# Patient Record
Sex: Female | Born: 1975 | Race: White | Hispanic: No | Marital: Married | Smoking: Current every day smoker
Health system: Southern US, Community
[De-identification: ages and names within clinical notes are randomized; demographics above are authoritative.]

## PROBLEM LIST (undated history)

## (undated) DIAGNOSIS — N6019 Diffuse cystic mastopathy of unspecified breast: Secondary | ICD-10-CM

## (undated) DIAGNOSIS — K219 Gastro-esophageal reflux disease without esophagitis: Secondary | ICD-10-CM

## (undated) DIAGNOSIS — M8580 Other specified disorders of bone density and structure, unspecified site: Secondary | ICD-10-CM

## (undated) DIAGNOSIS — F909 Attention-deficit hyperactivity disorder, unspecified type: Secondary | ICD-10-CM

## (undated) HISTORY — DX: Gastro-esophageal reflux disease without esophagitis: K21.9

## (undated) HISTORY — DX: Diffuse cystic mastopathy of unspecified breast: N60.19

## (undated) HISTORY — PX: TUBAL LIGATION: SHX77

## (undated) HISTORY — PX: ESOPHAGEAL DILATION: SHX303

## (undated) HISTORY — PX: WISDOM TOOTH EXTRACTION: SHX21

## (undated) HISTORY — DX: Other specified disorders of bone density and structure, unspecified site: M85.80

## (undated) HISTORY — DX: Attention-deficit hyperactivity disorder, unspecified type: F90.9

## (undated) HISTORY — PX: CATARACT EXTRACTION: SUR2

---

## 2011-05-26 ENCOUNTER — Ambulatory Visit (INDEPENDENT_AMBULATORY_CARE_PROVIDER_SITE_OTHER): Payer: 59 | Admitting: Gastroenterology

## 2011-05-26 ENCOUNTER — Other Ambulatory Visit (INDEPENDENT_AMBULATORY_CARE_PROVIDER_SITE_OTHER): Payer: 59

## 2011-05-26 ENCOUNTER — Encounter: Payer: Self-pay | Admitting: Gastroenterology

## 2011-05-26 DIAGNOSIS — K625 Hemorrhage of anus and rectum: Secondary | ICD-10-CM

## 2011-05-26 DIAGNOSIS — K219 Gastro-esophageal reflux disease without esophagitis: Secondary | ICD-10-CM

## 2011-05-26 LAB — IBC PANEL
Iron: 79 ug/dL (ref 42–145)
Saturation Ratios: 23.8 % (ref 20.0–50.0)
Transferrin: 237.1 mg/dL (ref 212.0–360.0)

## 2011-05-26 LAB — BASIC METABOLIC PANEL
BUN: 11 mg/dL (ref 6–23)
GFR: 88.29 mL/min (ref >60.00)
Glucose, Bld: 81 mg/dL (ref 70–99)
Potassium: 3.5 mEq/L (ref 3.5–5.1)

## 2011-05-26 LAB — HEPATIC FUNCTION PANEL
ALT: 11 U/L (ref 0–35)
AST: 15 U/L (ref 0–37)
Albumin: 4 g/dL (ref 3.5–5.2)
Total Protein: 7.2 g/dL (ref 6.0–8.3)

## 2011-05-26 LAB — CBC WITH DIFFERENTIAL/PLATELET
Eosinophils Relative: 4.6 % (ref 0.0–5.0)
HCT: 42.9 % (ref 36.0–46.0)
Lymphocytes Relative: 36.2 % (ref 12.0–46.0)
Monocytes Relative: 7.5 % (ref 3.0–12.0)
Neutrophils Relative %: 51.2 % (ref 43.0–77.0)
Platelets: 232 10*3/uL (ref 150.0–400.0)
WBC: 7.1 10*3/uL (ref 4.5–10.5)

## 2011-05-26 LAB — TSH: TSH: 1.91 u[IU]/mL (ref 0.35–5.50)

## 2011-05-26 MED ORDER — PEG-KCL-NACL-NASULF-NA ASC-C 100 G PO SOLR: 1.0000 | Freq: Once | ORAL | Status: DC

## 2011-05-26 NOTE — Patient Instructions (Addendum)
Please go to the basement upon leaving today to have your labs done. You have been scheduled for an Propofol Endoscopy/Colonoscopy with separate instructions given. Your prep kit has been sent to your pharmacy for you to pick up. You have been given a GERD handout to read.

## 2011-05-26 NOTE — Progress Notes (Signed)
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 She's had 6 months of vague abdominal discomfort, rather severe acid reflux with intermittent dysphagia, and periodic diarrhea with one episode of bright red blood per rectum 2 weeks ago. She still has intermittent rectal bleeding. Lab data showed normal CBC a negative H. pylori antigen. She denies alcohol, and instead use but does smoke a half-pack of cigarettes per day. Patient has not had previous barium radiographs or endoscopic procedures. Associated with her symptoms has been a 20 pound weight loss over the last 6 months. Her family history is remarkable for multiple allergies including esophageal cancer. Patient denies any specific hepatobiliary complaints or systemic complaints such as fever or chills.   I have reviewed this patient's present history, medical and surgical past history, allergies and medications.    Past Medical History  Diagnosis Date  . GERD (gastroesophageal reflux disease)    Past Surgical History  Procedure Date  . Tubal ligation     reports that she has been smoking.  She has never used smokeless tobacco. She reports that she drinks alcohol. She reports that she does not use illicit drugs. family history includes Breast cancer in her paternal aunt; Diabetes in her paternal grandmother; Esophageal cancer in her paternal grandfather; Heart disease in her maternal grandfather and maternal grandmother; Ovarian cancer in her paternal aunt; Stomach cancer in her maternal grandfather; and Uterine cancer in her mother. Allergies  Allergen Reactions  . Bee   . Fish Allergy       ROS: The remainder of the 10 point ROS is negative... she denies any gynecologic problems. Recent stool for occult blood was positive.     Physical Exam: General well developed well nourished patient in no acute distress, appearing her stated age Eyes PERRLA, no icterus,  fundoscopic exam per opthamologist Skin no lesions noted Neck supple, no adenopathy, no thyroid enlargement, no tenderness Chest clear to percussion and auscultation Heart no significant murmurs, gallops or rubs noted Abdomen no hepatosplenomegaly masses or tenderness, BS normal.  Extremities no acute joint lesions, edema, phlebitis or evidence of cellulitis. Neurologic patient oriented x 3, cranial nerves intact, no focal neurologic deficits noted. Psychological mental status normal and normal affect.  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 I have scheduled endoscopic exam with propofol sedation at her request, have reviewed a reflux regime, and have prescribed Dexilant 60 mg every morning. Colonoscopy also is scheduled. Screening labs including anemia and celiac profiles have been ordered. Otherwise, she is to continue medications per primary care. No diagnosis found.

## 2011-05-29 ENCOUNTER — Encounter: Payer: Self-pay | Admitting: Gastroenterology

## 2011-05-29 ENCOUNTER — Telehealth: Payer: Self-pay

## 2011-05-29 ENCOUNTER — Ambulatory Visit (AMBULATORY_SURGERY_CENTER): Payer: 59 | Admitting: Gastroenterology

## 2011-05-29 DIAGNOSIS — R110 Nausea: Secondary | ICD-10-CM | POA: Insufficient documentation

## 2011-05-29 DIAGNOSIS — K625 Hemorrhage of anus and rectum: Secondary | ICD-10-CM

## 2011-05-29 DIAGNOSIS — K219 Gastro-esophageal reflux disease without esophagitis: Secondary | ICD-10-CM

## 2011-05-29 DIAGNOSIS — R1310 Dysphagia, unspecified: Secondary | ICD-10-CM | POA: Insufficient documentation

## 2011-05-29 DIAGNOSIS — R634 Abnormal weight loss: Secondary | ICD-10-CM

## 2011-05-29 LAB — FERRITIN: Ferritin: 21.6 ng/mL (ref 10.0–291.0)

## 2011-05-29 MED ORDER — SODIUM CHLORIDE 0.9 % IV SOLN: 500.0000 mL | INTRAVENOUS | Status: DC

## 2011-05-29 NOTE — Patient Instructions (Signed)
Discharge instructions given with verbal understanding. Dilatation diet given. Avoid NSAIDS for the next two weeks. Resume previous medications.

## 2011-05-29 NOTE — Progress Notes (Signed)
Patient did not experience any of the following events: a burn prior to discharge; a fall within the facility; wrong site/side/patient/procedure/implant event; or a hospital transfer or hospital admission upon discharge from the facility. (G8907) Patient did not have preoperative order for IV antibiotic SSI prophylaxis. (G8918)  

## 2011-05-30 ENCOUNTER — Telehealth: Payer: Self-pay | Admitting: *Deleted

## 2011-05-30 DIAGNOSIS — K219 Gastro-esophageal reflux disease without esophagitis: Secondary | ICD-10-CM

## 2011-05-30 NOTE — Telephone Encounter (Signed)

## 2011-05-30 NOTE — Telephone Encounter (Signed)
Patient is scheduled for an abdominal ultrasound for 06/06/11 9:00 [facility].  I have attempted to reach her by phone, but she did not answer and has no machine.  I will mail her a letter with the appt details.

## 2011-05-31 ENCOUNTER — Encounter: Payer: Self-pay | Admitting: Gastroenterology

## 2011-06-05 ENCOUNTER — Encounter: Payer: Self-pay | Admitting: Gastroenterology

## 2011-06-05 ENCOUNTER — Telehealth: Payer: Self-pay | Admitting: Gastroenterology

## 2011-06-05 NOTE — Telephone Encounter (Signed)
lmom that she was negative for Celiac disease and H. Pylori; she may call back for questions.

## 2011-06-06 ENCOUNTER — Other Ambulatory Visit: Payer: Self-pay | Admitting: Family Medicine

## 2011-06-06 ENCOUNTER — Telehealth: Payer: Self-pay | Admitting: *Deleted

## 2011-06-06 ENCOUNTER — Encounter: Payer: Self-pay | Admitting: *Deleted

## 2011-06-06 ENCOUNTER — Ambulatory Visit (HOSPITAL_COMMUNITY)
Admission: RE | Admit: 2011-06-06 | Discharge: 2011-06-06 | Disposition: A | Discharge status: Home / Self Care | Payer: 59 | Source: Ambulatory Visit | Attending: Gastroenterology | Admitting: Gastroenterology

## 2011-06-06 DIAGNOSIS — R160 Hepatomegaly, not elsewhere classified: Secondary | ICD-10-CM

## 2011-06-06 DIAGNOSIS — R109 Unspecified abdominal pain: Secondary | ICD-10-CM | POA: Insufficient documentation

## 2011-06-06 DIAGNOSIS — R634 Abnormal weight loss: Secondary | ICD-10-CM | POA: Insufficient documentation

## 2011-06-06 NOTE — Telephone Encounter (Signed)
Message copied by Leonette Monarch on Tue Jun 06, 2011  3:56 PM ------      Message from: Jarold Motto, DAVID R      Created: Tue Jun 06, 2011  2:40 PM       Please schedule hepatic MRI exam her ultrasound report.

## 2011-06-07 ENCOUNTER — Ambulatory Visit (HOSPITAL_COMMUNITY)
Admission: RE | Admit: 2011-06-07 | Discharge: 2011-06-07 | Disposition: A | Discharge status: Home / Self Care | Payer: 59 | Source: Ambulatory Visit | Attending: Family Medicine | Admitting: Family Medicine

## 2011-06-07 DIAGNOSIS — R160 Hepatomegaly, not elsewhere classified: Secondary | ICD-10-CM

## 2011-06-07 DIAGNOSIS — D1803 Hemangioma of intra-abdominal structures: Secondary | ICD-10-CM | POA: Insufficient documentation

## 2011-06-07 DIAGNOSIS — K7689 Other specified diseases of liver: Secondary | ICD-10-CM | POA: Insufficient documentation

## 2011-06-07 MED ORDER — GADOBENATE DIMEGLUMINE 529 MG/ML IV SOLN: 12.0000 mL | Freq: Once | INTRAVENOUS | Status: AC | PRN

## 2011-06-09 ENCOUNTER — Telehealth: Payer: Self-pay | Admitting: Gastroenterology

## 2011-06-09 NOTE — Telephone Encounter (Signed)
Followup ultrasound in 6 months. This hemangioma is no call for alarm.Marland KitchenMarland Kitchen

## 2011-06-09 NOTE — Telephone Encounter (Signed)
Informed pt of Dr Norval Gable findings and suggestions. She will continue her Dexilant and see [place] on 07/10/11. Reminder sent for repeat U/S in 1 year.

## 2011-06-09 NOTE — Telephone Encounter (Signed)
Already done

## 2011-06-09 NOTE — Telephone Encounter (Signed)
Dr Jarold Motto, please review MRI I forwarded to you. Jennifer Parsons has called and is very upset; doesn't know what to do. Can you advise on f/u, severity of dx, etc. Thanks.

## 2011-07-10 ENCOUNTER — Ambulatory Visit: Payer: 59 | Admitting: Gastroenterology

## 2011-12-12 ENCOUNTER — Other Ambulatory Visit: Payer: Self-pay | Admitting: Family Medicine

## 2011-12-12 DIAGNOSIS — D1803 Hemangioma of intra-abdominal structures: Secondary | ICD-10-CM

## 2011-12-14 ENCOUNTER — Ambulatory Visit (HOSPITAL_COMMUNITY): Payer: 59

## 2012-06-10 ENCOUNTER — Telehealth: Payer: Self-pay | Admitting: *Deleted

## 2012-06-10 NOTE — Telephone Encounter (Signed)
Message copied by Florene Glen on Mon Jun 10, 2012  1:21 PM ------      Message from: Florene Glen      Created: Fri Jun 09, 2011 11:55 AM       Needs f/u u/s in 1 yr to evaluate liver hemangioma

## 2012-06-10 NOTE — Telephone Encounter (Signed)
lmom for pt to call back

## 2012-06-27 NOTE — Telephone Encounter (Signed)
Left message with female to have pt call back. 

## 2012-07-26 NOTE — Telephone Encounter (Signed)
Called pt at [facility], Dr Kathi Der [facility], informed her I have tried to get in touch with her, but maybe I had the wrong numbers. Informed pt she was to have a f/u U/S last June and now she needs another one. Informed her she does not need to see [place], we just want to make sure the hemangioma is not getting larger. Pt stated she has been f/u up with Dr Modesto Charon, but finances are tight now. She states if she decides to have one, she will have it done at her ofc and fax [place] the results. She thanked me for the call.

## 2012-10-28 ENCOUNTER — Ambulatory Visit (INDEPENDENT_AMBULATORY_CARE_PROVIDER_SITE_OTHER): Payer: 59

## 2012-10-28 ENCOUNTER — Encounter: Payer: Self-pay | Admitting: Nurse Practitioner

## 2012-10-28 ENCOUNTER — Ambulatory Visit (INDEPENDENT_AMBULATORY_CARE_PROVIDER_SITE_OTHER): Payer: 59 | Admitting: Nurse Practitioner

## 2012-10-28 VITALS — BP 123/78 | HR 63 | Temp 98.3°F

## 2012-10-28 DIAGNOSIS — S99912A Unspecified injury of left ankle, initial encounter: Secondary | ICD-10-CM

## 2012-10-28 DIAGNOSIS — S82892A Other fracture of left lower leg, initial encounter for closed fracture: Secondary | ICD-10-CM

## 2012-10-28 DIAGNOSIS — S99911A Unspecified injury of right ankle, initial encounter: Secondary | ICD-10-CM

## 2012-10-28 DIAGNOSIS — S8990XA Unspecified injury of unspecified lower leg, initial encounter: Secondary | ICD-10-CM

## 2012-10-28 DIAGNOSIS — S82899A Other fracture of unspecified lower leg, initial encounter for closed fracture: Secondary | ICD-10-CM

## 2012-10-28 DIAGNOSIS — S93401A Sprain of unspecified ligament of right ankle, initial encounter: Secondary | ICD-10-CM

## 2012-10-28 MED ORDER — HYDROCODONE-ACETAMINOPHEN 5-325 MG PO TABS: 1.0000 | ORAL_TABLET | Freq: Four times a day (QID) | ORAL | Status: DC | PRN

## 2012-10-28 MED ORDER — HYDROCODONE-ACETAMINOPHEN 10-325 MG PO TABS: 1.0000 | ORAL_TABLET | Freq: Four times a day (QID) | ORAL | Status: DC | PRN

## 2012-10-28 NOTE — Progress Notes (Signed)
  Subjective:    Patient ID: Jennifer Parsons, female    DOB: [birth year], 37 y.o.   MRN: [id number]  HPI    Review of Systems     Objective:   Physical Exam        Assessment & Plan:

## 2012-10-28 NOTE — Patient Instructions (Signed)
Ankle Fracture  A fracture is a break in the bone. A cast or splint is used to protect and keep your injured bone from moving.   HOME CARE INSTRUCTIONS    Use your crutches as directed.   To lessen the swelling, keep the injured leg elevated while sitting or lying down.   Apply ice to the injury for 15 to 20 minutes, 3 to 4 times per day while awake for 2 days. Put the ice in a plastic bag and place a thin towel between the bag of ice and your cast.   If you have a plaster or fiberglass cast:   Do not try to scratch the skin under the cast using sharp or pointed objects.   Check the skin around the cast every day. You may put lotion on any red or sore areas.   Keep your cast dry and clean.   If you have a plaster splint:   Wear the splint as directed.   You may loosen the elastic around the splint if your toes become numb, tingle, or turn cold or blue.   Do not put pressure on any part of your cast or splint; it may break. Rest your cast only on a pillow the first 24 hours until it is fully hardened.   Your cast or splint can be protected during bathing with a plastic bag. Do not lower the cast or splint into water.   Take medications as directed by your caregiver. Only take over-the-counter or prescription medicines for pain, discomfort, or fever as directed by your caregiver.   Do not drive a vehicle until your caregiver specifically tells you it is safe to do so.   If your caregiver has given you a follow-up appointment, it is very important to keep that appointment. Not keeping the appointment could result in a chronic or permanent injury, pain, and disability. If there is any problem keeping the appointment, you must call back to this facility for assistance.  SEEK IMMEDIATE MEDICAL CARE IF:    Your cast gets damaged or breaks.   You have continued severe pain or more swelling than you did before the cast was put on.   Your skin or toenails below the injury turn blue or gray, or feel cold or  numb.   There is a bad smell or new stains and/or purulent (pus like) drainage coming from under the cast.  If you do not have a window in your cast for observing the wound, a discharge or minor bleeding may show up as a stain on the outside of your cast. Report these findings to your caregiver.  MAKE SURE YOU:    Understand these instructions.   Will watch your condition.   Will get help right away if you are not doing well or get worse.  Document Released: 06/23/2000 Document Revised: 09/18/2011 Document Reviewed: 01/28/2008  [employer] Patient Information 2013 [employer].

## 2012-10-28 NOTE — Addendum Note (Signed)
Addended by: Bennie Pierini on: 10/28/2012 10:06 AM   Modules accepted: Orders

## 2012-10-28 NOTE — Progress Notes (Signed)
  Subjective:    Patient ID: Jennifer Parsons, female    DOB: 1976/05/30, 37 y.o.   MRN: [id number]  HPI-Patient fell of her porch Saturday night and injured her left ankle. Patient thought was just a sprain and has been walking on it. It is swollen and tender to touch.    Review of Systems  Neurological: Positive for weakness.  All other systems reviewed and are negative.       Objective:   Physical Exam  Constitutional: She appears well-developed and well-nourished.  Cardiovascular: Normal rate.   Pulmonary/Chest: Effort normal.  Musculoskeletal:  Left ankle has lateral ecchymosis and edema. Very tender to light touch. Decreased ROM due to pain with any movement.   Right ankle xray- No fracture Left ankle fracture- nondisplaced lateral malleolar fracture Preliminary reading by Paulene Floor, FNP  [facility]        Assessment & Plan:  Left ankle fracture  To ortho asap  Mary-Margaret Daphine Deutscher, FNP

## 2013-01-23 ENCOUNTER — Other Ambulatory Visit: Payer: Self-pay | Admitting: Orthopedic Surgery

## 2013-01-23 ENCOUNTER — Ambulatory Visit (INDEPENDENT_AMBULATORY_CARE_PROVIDER_SITE_OTHER): Payer: 59

## 2013-01-23 DIAGNOSIS — S8290XD Unspecified fracture of unspecified lower leg, subsequent encounter for closed fracture with routine healing: Secondary | ICD-10-CM

## 2013-01-23 DIAGNOSIS — S82402G Unspecified fracture of shaft of left fibula, subsequent encounter for closed fracture with delayed healing: Secondary | ICD-10-CM

## 2013-04-01 ENCOUNTER — Encounter: Payer: Self-pay | Admitting: *Deleted

## 2013-04-01 ENCOUNTER — Other Ambulatory Visit: Payer: Self-pay | Admitting: *Deleted

## 2013-04-01 DIAGNOSIS — R6884 Jaw pain: Secondary | ICD-10-CM

## 2013-04-01 DIAGNOSIS — H9209 Otalgia, unspecified ear: Secondary | ICD-10-CM

## 2013-04-01 MED ORDER — MELOXICAM 15 MG PO TABS: 15.0000 mg | ORAL_TABLET | Freq: Every day | ORAL | Status: DC

## 2013-04-01 NOTE — Progress Notes (Signed)
Patient ID: Jennifer Parsons, female   DOB: 12/20/1975, 37 y.o.   MRN: [id number] Dr Christell Constant states that he wants mobic called in for pt due to ear pain/jaw pain. Pt to decrease jaw movement and start mobic - if npo better in a few days to see dr Christell Constant.

## 2013-04-22 ENCOUNTER — Ambulatory Visit (INDEPENDENT_AMBULATORY_CARE_PROVIDER_SITE_OTHER): Payer: 59 | Admitting: *Deleted

## 2013-04-22 DIAGNOSIS — Z23 Encounter for immunization: Secondary | ICD-10-CM

## 2013-05-19 ENCOUNTER — Ambulatory Visit (INDEPENDENT_AMBULATORY_CARE_PROVIDER_SITE_OTHER): Payer: 59

## 2013-05-19 ENCOUNTER — Encounter: Payer: Self-pay | Admitting: Family Medicine

## 2013-05-19 ENCOUNTER — Ambulatory Visit (INDEPENDENT_AMBULATORY_CARE_PROVIDER_SITE_OTHER): Payer: 59 | Admitting: Family Medicine

## 2013-05-19 ENCOUNTER — Other Ambulatory Visit: Payer: Self-pay | Admitting: Family Medicine

## 2013-05-19 ENCOUNTER — Encounter (INDEPENDENT_AMBULATORY_CARE_PROVIDER_SITE_OTHER): Payer: Self-pay

## 2013-05-19 VITALS — BP 111/70 | HR 53 | Temp 97.0°F | Ht 63.0 in | Wt 143.0 lb

## 2013-05-19 DIAGNOSIS — R5381 Other malaise: Secondary | ICD-10-CM

## 2013-05-19 DIAGNOSIS — K219 Gastro-esophageal reflux disease without esophagitis: Secondary | ICD-10-CM

## 2013-05-19 DIAGNOSIS — R0602 Shortness of breath: Secondary | ICD-10-CM

## 2013-05-19 DIAGNOSIS — J209 Acute bronchitis, unspecified: Secondary | ICD-10-CM

## 2013-05-19 DIAGNOSIS — Z0000 Encounter for general adult medical examination without abnormal findings: Secondary | ICD-10-CM

## 2013-05-19 DIAGNOSIS — R05 Cough: Secondary | ICD-10-CM

## 2013-05-19 DIAGNOSIS — D1803 Hemangioma of intra-abdominal structures: Secondary | ICD-10-CM

## 2013-05-19 DIAGNOSIS — J309 Allergic rhinitis, unspecified: Secondary | ICD-10-CM

## 2013-05-19 LAB — POCT CBC
HCT, POC: 46.5 % (ref 37.7–47.9)
Hemoglobin: 15.5 g/dL (ref 12.2–16.2)
MCH, POC: 30.8 pg (ref 27–31.2)
MCHC: 33.3 g/dL (ref 31.8–35.4)
MCV: 92.7 fL (ref 80–97)
POC LYMPH PERCENT: 39.6 %L (ref 10–50)

## 2013-05-19 MED ORDER — AZITHROMYCIN 250 MG PO TABS: ORAL_TABLET | ORAL | Status: DC

## 2013-05-19 MED ORDER — FLUTICASONE PROPIONATE 50 MCG/ACT NA SUSP: 2.0000 | Freq: Every day | NASAL | Status: DC

## 2013-05-19 NOTE — Addendum Note (Signed)
Addended by: Tommas Olp on: 05/19/2013 11:33 AM   Modules accepted: Orders

## 2013-05-19 NOTE — Progress Notes (Signed)
Subjective:    Patient ID: Jennifer Parsons, female    DOB: [birth year], 37 y.o.   MRN: [id number]  HPI Patient here today for annual physical exam- no pap, and cough. She continues to have head congestion and a nighttime cough. She continues to smoke about half a pack a day. The ear problem that we treated several weeks ago is better.     Patient Active Problem List   Diagnosis Date Noted  . Hemorrhage of rectum and anus 05/29/2011  . Esophageal reflux 05/29/2011  . Nausea 05/29/2011  . Esophageal dysphagia 05/29/2011  . Rectal bleeding 05/26/2011  . GERD (gastroesophageal reflux disease) 05/26/2011   Outpatient Encounter Prescriptions as of 05/19/2013  Medication Sig  . EPINEPHrine (EPIPEN 2-PAK) 0.3 mg/0.3 mL DEVI Inject into the muscle once.  . meloxicam (MOBIC) 15 MG tablet Take 1 tablet (15 mg total) by mouth daily.  . [DISCONTINUED] dexlansoprazole (DEXILANT) 60 MG capsule Take 60 mg by mouth daily.      Review of Systems  Constitutional: Negative.   HENT: Positive for congestion (for 10days), postnasal drip and sinus pressure.   Eyes: Negative.   Respiratory: Positive for cough and shortness of breath (just starting).   Cardiovascular: Negative.   Gastrointestinal: Negative.   Endocrine: Negative.   Genitourinary: Negative.   Musculoskeletal: Negative.   Skin: Negative.   Allergic/Immunologic: Negative.   Neurological: Negative.   Hematological: Negative.   Psychiatric/Behavioral: Negative.        Objective:   Physical Exam  Nursing note and vitals reviewed. Constitutional: She is oriented to person, place, and time. She appears well-developed and well-nourished. No distress.  HENT:  Head: Normocephalic and atraumatic.  Right Ear: External ear normal.  Left Ear: External ear normal.  Nose: Nose normal.  Mouth/Throat: Oropharynx is clear and moist. No oropharyngeal exudate.  There is sinus tenderness in the frontal maxillary and ethmoid areas. On exam there is  minimal nasal congestion and there is no drainage in her throat.  Eyes: Conjunctivae and EOM are normal. Pupils are equal, round, and reactive to light. Right eye exhibits no discharge. Left eye exhibits no discharge. No scleral icterus.  Visualization of the left fundus was difficult due to patient's light sensitivity  Neck: Normal range of motion. Neck supple. No JVD present. No thyromegaly present.  Cardiovascular: Normal rate, regular rhythm, normal heart sounds and intact distal pulses.  Exam reveals no gallop and no friction rub.   No murmur heard. At 60 per minute  Pulmonary/Chest: Effort normal and breath sounds normal. No respiratory distress. She has no wheezes. She has no rales. She exhibits no tenderness.  Patient has a dry cough  Abdominal: Soft. Bowel sounds are normal. She exhibits no mass. There is tenderness. There is no rebound and no guarding.  There is epigastric tenderness but no organ enlargement  Musculoskeletal: Normal range of motion. She exhibits no edema and no tenderness.  Lymphadenopathy:    She has no cervical adenopathy.  Neurological: She is alert and oriented to person, place, and time. She has normal reflexes. No cranial nerve deficit.  Skin: Skin is warm and dry. No rash noted.  Psychiatric: She has a normal mood and affect. Her behavior is normal. Judgment and thought content normal.   BP 111/70  Pulse 53  Temp(Src) 97 F (36.1 C) (Oral)  Ht 5\' 3"  (1.6 m)  Wt 143 lb (64.864 kg)  BMI 25.34 kg/m2  LMP 05/05/2013        Assessment & Plan:  1. Annual physical exam   2. GERD (gastroesophageal reflux disease)   3. Other malaise and fatigue   4. Cough   5. Shortness of breath   6. Liver hemangioma   7. Allergic rhinitis   8. Acute bronchitis    Orders Placed This Encounter  Procedures  . DG Chest 2 View    Standing Status: Future     Number of Occurrences:      Standing Expiration Date: 07/19/2014    Order Specific Question:  Reason for  Exam (SYMPTOM  OR DIAGNOSIS REQUIRED)    Answer:  cough    Order Specific Question:  Is the patient pregnant?    Answer:  No    Order Specific Question:  Preferred imaging location?    Answer:  Internal  . Hepatic function panel  . BMP8+EGFR  . Lipid panel  . Thyroid Panel With TSH  . Vit D  25 hydroxy (rtn osteoporosis monitoring)  . POCT CBC    We will schedule a visit for you to have a Pap and pelvic. We will also schedule an ultrasound of your liver to followup on the hemangioma  Meds ordered this encounter  Medications  . azithromycin (ZITHROMAX) 250 MG tablet    Sig: 2 tablets the first day then one daily until completed    Dispense:  6 tablet    Refill:  0  . fluticasone (FLONASE) 50 MCG/ACT nasal spray    Sig: Place 2 sprays into both nostrils daily.    Dispense:  16 g    Refill:  6   Patient Instructions  Continue current medications. Continue good therapeutic lifestyle changes.  Fall precautions discussed with patient. Follow up as planned and earlier as needed.  Take Mucinex maximum strength one twice daily over the counter with a large glass of water Take other medications as directed Use sample of Symbicort 80/4.5   --2 puffs twice daily, rinse mouth after using Try to stop smoking Drink plenty of fluids   Nyra Capes MD

## 2013-05-19 NOTE — Patient Instructions (Addendum)
Continue current medications. Continue good therapeutic lifestyle changes.  Fall precautions discussed with patient. Follow up as planned and earlier as needed.  Take Mucinex maximum strength one twice daily over the counter with a large glass of water Take other medications as directed Use sample of Symbicort 80/4.5   --2 puffs twice daily, rinse mouth after using Try to stop smoking Drink plenty of fluids

## 2013-05-20 LAB — BMP8+EGFR
Calcium: 9.2 mg/dL (ref 8.7–10.2)
Creatinine, Ser: 0.91 mg/dL (ref 0.57–1.00)
GFR calc Af Amer: 94 mL/min/{1.73_m2} (ref >59)
GFR calc non Af Amer: 81 mL/min/{1.73_m2} (ref >59)
Sodium: 140 mmol/L (ref 134–144)

## 2013-05-20 LAB — HEPATIC FUNCTION PANEL
ALT: 12 IU/L (ref 0–32)
AST: 20 IU/L (ref 0–40)
Albumin: 4.1 g/dL (ref 3.5–5.5)
Bilirubin, Direct: 0.2 mg/dL (ref 0.00–0.40)
Total Bilirubin: 0.6 mg/dL (ref 0.0–1.2)
Total Protein: 6.3 g/dL (ref 6.0–8.5)

## 2013-05-20 LAB — THYROID PANEL WITH TSH
Free Thyroxine Index: 2 (ref 1.2–4.9)
T4, Total: 6.5 ug/dL (ref 4.5–12.0)

## 2013-05-20 LAB — LIPID PANEL
Chol/HDL Ratio: 2.1 ratio units (ref 0.0–4.4)
Cholesterol, Total: 147 mg/dL (ref 100–199)
HDL: 71 mg/dL (ref >39)
LDL Calculated: 60 mg/dL (ref 0–99)
VLDL Cholesterol Cal: 16 mg/dL (ref 5–40)

## 2013-05-20 LAB — VITAMIN D 25 HYDROXY (VIT D DEFICIENCY, FRACTURES): Vit D, 25-Hydroxy: 31.1 ng/mL (ref 30.0–100.0)

## 2013-09-24 ENCOUNTER — Ambulatory Visit (INDEPENDENT_AMBULATORY_CARE_PROVIDER_SITE_OTHER): Payer: 59 | Admitting: Family Medicine

## 2013-09-24 ENCOUNTER — Encounter: Payer: Self-pay | Admitting: Family Medicine

## 2013-09-24 VITALS — BP 109/72 | HR 68 | Temp 97.9°F | Ht 63.0 in | Wt 144.0 lb

## 2013-09-24 DIAGNOSIS — R635 Abnormal weight gain: Secondary | ICD-10-CM

## 2013-09-24 MED ORDER — PHENTERMINE HCL 37.5 MG PO CAPS: 37.5000 mg | ORAL_CAPSULE | ORAL | Status: DC

## 2013-09-24 NOTE — Progress Notes (Signed)
   Subjective:    Patient ID: Jennifer Parsons, female    DOB: 1975-10-19, 38 y.o.   MRN: [id number]  HPI This 38 y.o. female presents for evaluation of weight loss.  She has recent labs and she has  Put on over 20 pounds in the last few years..   Review of Systems No chest pain, SOB, HA, dizziness, vision change, N/V, diarrhea, constipation, dysuria, urinary urgency or frequency, myalgias, arthralgias or rash.     Objective:   Physical Exam  Vital signs noted  Well developed well nourished female.  HEENT - Head atraumatic Normocephalic                Eyes - PERRLA, Conjuctiva - clear Sclera- Clear EOMI                Ears - EAC's Wnl TM's Wnl Gross Hearing WNL                Nose - Nares patent                 Throat - oropharanx wnl Respiratory - Lungs CTA bilateral Cardiac - RRR S1 and S2 without murmur GI - Abdomen soft Nontender and bowel sounds active x 4 Extremities - No edema. Neuro - Grossly intact.      Assessment & Plan:  Weight gain - Plan: phentermine 37.5 MG capsule #30 w/3rf Lysbeth Penner FNP

## 2013-12-29 ENCOUNTER — Other Ambulatory Visit: Payer: Self-pay | Admitting: Nurse Practitioner

## 2013-12-29 ENCOUNTER — Other Ambulatory Visit (INDEPENDENT_AMBULATORY_CARE_PROVIDER_SITE_OTHER): Payer: 59

## 2013-12-29 DIAGNOSIS — N390 Urinary tract infection, site not specified: Secondary | ICD-10-CM

## 2013-12-29 LAB — POCT URINALYSIS DIPSTICK
BILIRUBIN UA: NEGATIVE
Glucose, UA: NEGATIVE
Ketones, UA: NEGATIVE
NITRITE UA: POSITIVE
PH UA: 6
Spec Grav, UA: >=1.03
Urobilinogen, UA: NEGATIVE

## 2013-12-29 LAB — POCT UA - MICROSCOPIC ONLY
Casts, Ur, LPF, POC: NEGATIVE
Crystals, Ur, HPF, POC: NEGATIVE
Mucus, UA: NEGATIVE

## 2013-12-29 LAB — POCT WET PREP (WET MOUNT)
KOH WET PREP POC: POSITIVE
TRICHOMONAS WET PREP HPF POC: NEGATIVE

## 2013-12-29 MED ORDER — CIPROFLOXACIN HCL 500 MG PO TABS: 500.0000 mg | ORAL_TABLET | Freq: Two times a day (BID) | ORAL | Status: DC

## 2013-12-29 MED ORDER — FLUCONAZOLE 150 MG PO TABS
ORAL_TABLET | ORAL | Status: DC
Start: 2013-12-29 — End: 2014-11-10

## 2014-03-26 ENCOUNTER — Other Ambulatory Visit: Payer: Self-pay | Admitting: Nurse Practitioner

## 2014-03-27 NOTE — Telephone Encounter (Signed)
Patient last seen in office on 09-24-13. Please advise on refill. If approved please route to Pool A so nurse can call patient to pick up. Rx will print

## 2014-05-26 DIAGNOSIS — Z0000 Encounter for general adult medical examination without abnormal findings: Secondary | ICD-10-CM | POA: Diagnosis not present

## 2014-11-10 ENCOUNTER — Other Ambulatory Visit: Payer: Self-pay | Admitting: Nurse Practitioner

## 2014-11-10 ENCOUNTER — Other Ambulatory Visit (INDEPENDENT_AMBULATORY_CARE_PROVIDER_SITE_OTHER): Payer: 59

## 2014-11-10 DIAGNOSIS — R102 Pelvic and perineal pain: Secondary | ICD-10-CM

## 2014-11-10 DIAGNOSIS — R300 Dysuria: Secondary | ICD-10-CM

## 2014-11-10 LAB — POCT UA - MICROSCOPIC ONLY
CASTS, UR, LPF, POC: NEGATIVE
CRYSTALS, UR, HPF, POC: NEGATIVE
Mucus, UA: NEGATIVE

## 2014-11-10 LAB — POCT URINALYSIS DIPSTICK
Bilirubin, UA: NEGATIVE
Glucose, UA: NEGATIVE
KETONES UA: NEGATIVE
Nitrite, UA: NEGATIVE
Protein, UA: NEGATIVE
SPEC GRAV UA: 1.01
Urobilinogen, UA: NEGATIVE
pH, UA: 7.5

## 2014-11-10 LAB — POCT WET PREP (WET MOUNT): KOH Wet Prep POC: POSITIVE

## 2014-11-10 MED ORDER — FLUCONAZOLE 150 MG PO TABS: ORAL_TABLET | ORAL | Status: DC

## 2014-11-10 NOTE — Progress Notes (Signed)
Lab only 

## 2014-11-11 LAB — URINE CULTURE: Organism ID, Bacteria: NO GROWTH

## 2014-11-21 ENCOUNTER — Other Ambulatory Visit: Payer: Self-pay | Admitting: Family Medicine

## 2014-11-23 NOTE — Telephone Encounter (Signed)
NOt  Seen in office since 09/2013 by oxford

## 2014-12-03 ENCOUNTER — Other Ambulatory Visit: Payer: Self-pay | Admitting: Nurse Practitioner

## 2014-12-03 MED ORDER — DOXYCYCLINE HYCLATE 100 MG PO TABS: 100.0000 mg | ORAL_TABLET | Freq: Two times a day (BID) | ORAL | Status: DC

## 2015-02-26 ENCOUNTER — Other Ambulatory Visit: Payer: Self-pay | Admitting: Nurse Practitioner

## 2015-02-26 ENCOUNTER — Other Ambulatory Visit (INDEPENDENT_AMBULATORY_CARE_PROVIDER_SITE_OTHER): Payer: 59

## 2015-02-26 DIAGNOSIS — Z0000 Encounter for general adult medical examination without abnormal findings: Secondary | ICD-10-CM | POA: Diagnosis not present

## 2015-02-26 DIAGNOSIS — IMO0001 Reserved for inherently not codable concepts without codable children: Secondary | ICD-10-CM

## 2015-02-26 NOTE — Progress Notes (Signed)
Lab only 

## 2015-02-27 LAB — CMP14+EGFR
ALBUMIN: 4.1 g/dL (ref 3.5–5.5)
ALK PHOS: 69 IU/L (ref 39–117)
ALT: 12 IU/L (ref 0–32)
AST: 18 IU/L (ref 0–40)
Albumin/Globulin Ratio: 1.5 (ref 1.1–2.5)
BILIRUBIN TOTAL: 0.4 mg/dL (ref 0.0–1.2)
BUN / CREAT RATIO: 10 (ref 8–20)
BUN: 8 mg/dL (ref 6–20)
CHLORIDE: 102 mmol/L (ref 97–108)
CO2: 24 mmol/L (ref 18–29)
Calcium: 9.3 mg/dL (ref 8.7–10.2)
Creatinine, Ser: 0.83 mg/dL (ref 0.57–1.00)
GFR calc Af Amer: 103 mL/min/{1.73_m2} (ref >59)
GFR calc non Af Amer: 90 mL/min/{1.73_m2} (ref >59)
GLUCOSE: 72 mg/dL (ref 65–99)
Globulin, Total: 2.7 g/dL (ref 1.5–4.5)
Potassium: 4.2 mmol/L (ref 3.5–5.2)
SODIUM: 141 mmol/L (ref 134–144)
Total Protein: 6.8 g/dL (ref 6.0–8.5)

## 2015-02-27 LAB — CBC WITH DIFFERENTIAL/PLATELET
BASOS ABS: 0.1 10*3/uL (ref 0.0–0.2)
Basos: 1 %
EOS (ABSOLUTE): 0.5 10*3/uL — AB (ref 0.0–0.4)
Eos: 7 %
Hematocrit: 44.8 % (ref 34.0–46.6)
Hemoglobin: 15.3 g/dL (ref 11.1–15.9)
IMMATURE GRANULOCYTES: 0 %
Immature Grans (Abs): 0 10*3/uL (ref 0.0–0.1)
LYMPHS: 46 %
Lymphocytes Absolute: 3 10*3/uL (ref 0.7–3.1)
MCH: 32.1 pg (ref 26.6–33.0)
MCHC: 34.2 g/dL (ref 31.5–35.7)
MCV: 94 fL (ref 79–97)
MONOS ABS: 0.6 10*3/uL (ref 0.1–0.9)
Monocytes: 9 %
NEUTROS PCT: 37 %
Neutrophils Absolute: 2.5 10*3/uL (ref 1.4–7.0)
PLATELETS: 298 10*3/uL (ref 150–379)
RBC: 4.76 x10E6/uL (ref 3.77–5.28)
RDW: 13.3 % (ref 12.3–15.4)
WBC: 6.6 10*3/uL (ref 3.4–10.8)

## 2015-02-27 LAB — THYROID PANEL WITH TSH
Free Thyroxine Index: 2.4 (ref 1.2–4.9)
T3 UPTAKE RATIO: 30 % (ref 24–39)
T4 TOTAL: 8.1 ug/dL (ref 4.5–12.0)
TSH: 2.98 u[IU]/mL (ref 0.450–4.500)

## 2015-02-27 LAB — LIPID PANEL
CHOLESTEROL TOTAL: 145 mg/dL (ref 100–199)
Chol/HDL Ratio: 1.7 ratio units (ref 0.0–4.4)
HDL: 83 mg/dL (ref >39)
LDL Calculated: 44 mg/dL (ref 0–99)
TRIGLYCERIDES: 89 mg/dL (ref 0–149)
VLDL Cholesterol Cal: 18 mg/dL (ref 5–40)

## 2015-03-02 ENCOUNTER — Ambulatory Visit (INDEPENDENT_AMBULATORY_CARE_PROVIDER_SITE_OTHER): Payer: 59 | Admitting: Nurse Practitioner

## 2015-03-02 ENCOUNTER — Encounter: Payer: Self-pay | Admitting: Nurse Practitioner

## 2015-03-02 VITALS — BP 111/74 | HR 51 | Temp 97.2°F | Ht 63.0 in | Wt 140.0 lb

## 2015-03-02 DIAGNOSIS — Z0000 Encounter for general adult medical examination without abnormal findings: Secondary | ICD-10-CM | POA: Diagnosis not present

## 2015-03-02 NOTE — Patient Instructions (Signed)

## 2015-03-02 NOTE — Progress Notes (Signed)
   Subjective:    Patient ID: Jennifer Parsons, female    DOB: 1976-02-19, 39 y.o.   MRN: [id number]  HPI Patient in today for CPE without pap. SHe is doing well  today without complaints.    Review of Systems  Constitutional: Negative.   HENT: Negative.   Respiratory: Negative.   Cardiovascular: Negative.   Gastrointestinal: Negative.   Genitourinary: Negative.   Neurological: Negative.   Psychiatric/Behavioral: Negative.   All other systems reviewed and are negative.      Objective:   Physical Exam  Constitutional: She is oriented to person, place, and time. She appears well-developed and well-nourished.  HENT:  Head: Normocephalic.  Right Ear: Hearing, tympanic membrane, external ear and ear canal normal.  Left Ear: Hearing, tympanic membrane, external ear and ear canal normal.  Nose: Nose normal.  Mouth/Throat: Uvula is midline, oropharynx is clear and moist and mucous membranes are normal.  Eyes: Conjunctivae and EOM are normal. Pupils are equal, round, and reactive to light.  Neck: Normal range of motion. Neck supple. No JVD present. No thyromegaly present.  Cardiovascular: Normal rate, normal heart sounds and intact distal pulses.   No murmur heard. Pulmonary/Chest: Effort normal and breath sounds normal. She has no wheezes. She has no rales.  Abdominal: Soft. Bowel sounds are normal. She exhibits no mass.  Musculoskeletal: Normal range of motion.  Neurological: She is alert and oriented to person, place, and time. She has normal reflexes.  Skin: Skin is warm and dry.  Psychiatric: She has a normal mood and affect. Her behavior is normal. Judgment and thought content normal.   BP 111/74 mmHg  Pulse 51  Temp(Src) 97.2 F (36.2 C) (Oral)  Ht 5\' 3"  (1.6 m)  Wt 140 lb (63.504 kg)  BMI 24.81 kg/m2        Assessment & Plan:  1. Annual physical exam   Labs reviewed at appointment Health maintenance reviewed Diet and exercise encouraged Continue all  meds Follow up  In 39 year   [place], FNP

## 2015-04-01 ENCOUNTER — Other Ambulatory Visit: Payer: Self-pay | Admitting: *Deleted

## 2015-04-01 MED ORDER — PHENTERMINE HCL 37.5 MG PO TABS: ORAL_TABLET | ORAL | Status: DC

## 2015-04-01 NOTE — Telephone Encounter (Signed)
Last filled 01/16/15, last seen 03/02/15. [facility] to [facility]

## 2015-06-10 ENCOUNTER — Other Ambulatory Visit: Payer: Self-pay | Admitting: Family Medicine

## 2015-06-10 MED ORDER — CIPROFLOXACIN HCL 250 MG PO TABS: 250.0000 mg | ORAL_TABLET | Freq: Two times a day (BID) | ORAL | Status: DC

## 2015-06-10 MED ORDER — FLUCONAZOLE 150 MG PO TABS: 150.0000 mg | ORAL_TABLET | Freq: Once | ORAL | Status: DC

## 2015-06-10 MED ORDER — METRONIDAZOLE 500 MG PO TABS: 500.0000 mg | ORAL_TABLET | Freq: Two times a day (BID) | ORAL | Status: DC

## 2015-06-10 NOTE — Telephone Encounter (Signed)
Patient with vaginal itching, dc, and dysuria. Moderate clues and hyphae on wet prep.   Treat with flagyl and diflucan.   Laroy Apple, MD [facility] 06/10/2015, 5:41 PM

## 2015-07-31 DIAGNOSIS — H5213 Myopia, bilateral: Secondary | ICD-10-CM | POA: Diagnosis not present

## 2015-10-13 ENCOUNTER — Encounter (INDEPENDENT_AMBULATORY_CARE_PROVIDER_SITE_OTHER): Payer: Self-pay

## 2016-02-08 ENCOUNTER — Other Ambulatory Visit: Payer: 59

## 2016-02-08 ENCOUNTER — Other Ambulatory Visit: Payer: Self-pay | Admitting: *Deleted

## 2016-02-08 DIAGNOSIS — Z0000 Encounter for general adult medical examination without abnormal findings: Secondary | ICD-10-CM

## 2016-02-08 DIAGNOSIS — R7989 Other specified abnormal findings of blood chemistry: Secondary | ICD-10-CM

## 2016-02-08 DIAGNOSIS — N946 Dysmenorrhea, unspecified: Secondary | ICD-10-CM

## 2016-02-09 LAB — CBC WITH DIFFERENTIAL/PLATELET
BASOS: 1 %
Basophils Absolute: 0.1 10*3/uL (ref 0.0–0.2)
EOS (ABSOLUTE): 0.2 10*3/uL (ref 0.0–0.4)
EOS: 4 %
HEMATOCRIT: 44.8 % (ref 34.0–46.6)
HEMOGLOBIN: 14.7 g/dL (ref 11.1–15.9)
IMMATURE GRANS (ABS): 0 10*3/uL (ref 0.0–0.1)
Immature Granulocytes: 0 %
LYMPHS ABS: 2.4 10*3/uL (ref 0.7–3.1)
LYMPHS: 45 %
MCH: 31.9 pg (ref 26.6–33.0)
MCHC: 32.8 g/dL (ref 31.5–35.7)
MCV: 97 fL (ref 79–97)
MONOCYTES: 8 %
Monocytes Absolute: 0.4 10*3/uL (ref 0.1–0.9)
NEUTROS ABS: 2.3 10*3/uL (ref 1.4–7.0)
Neutrophils: 42 %
Platelets: 243 10*3/uL (ref 150–379)
RBC: 4.61 x10E6/uL (ref 3.77–5.28)
RDW: 13.5 % (ref 12.3–15.4)
WBC: 5.5 10*3/uL (ref 3.4–10.8)

## 2016-02-09 LAB — HGB A1C W/O EAG: Hgb A1c MFr Bld: 5.1 % (ref 4.8–5.6)

## 2016-02-09 LAB — CMP14+EGFR
ALBUMIN: 3.9 g/dL (ref 3.5–5.5)
ALK PHOS: 72 IU/L (ref 39–117)
ALT: 12 IU/L (ref 0–32)
AST: 20 IU/L (ref 0–40)
Albumin/Globulin Ratio: 1.4 (ref 1.2–2.2)
BILIRUBIN TOTAL: 0.3 mg/dL (ref 0.0–1.2)
BUN / CREAT RATIO: 14 (ref 9–23)
BUN: 12 mg/dL (ref 6–20)
CHLORIDE: 102 mmol/L (ref 96–106)
CO2: 26 mmol/L (ref 18–29)
CREATININE: 0.83 mg/dL (ref 0.57–1.00)
Calcium: 8.9 mg/dL (ref 8.7–10.2)
GFR, EST AFRICAN AMERICAN: 103 mL/min/{1.73_m2} (ref >59)
GFR, EST NON AFRICAN AMERICAN: 89 mL/min/{1.73_m2} (ref >59)
GLUCOSE: 78 mg/dL (ref 65–99)
Globulin, Total: 2.7 g/dL (ref 1.5–4.5)
Potassium: 4.4 mmol/L (ref 3.5–5.2)
Sodium: 142 mmol/L (ref 134–144)
Total Protein: 6.6 g/dL (ref 6.0–8.5)

## 2016-02-09 LAB — THYROID PANEL WITH TSH
Free Thyroxine Index: 1.4 (ref 1.2–4.9)
T3 UPTAKE RATIO: 25 % (ref 24–39)
T4 TOTAL: 5.4 ug/dL (ref 4.5–12.0)
TSH: 2.96 u[IU]/mL (ref 0.450–4.500)

## 2016-02-09 LAB — AMENORRHEA PROFILE
FSH: 14 m[IU]/mL
LH: 25.5 m[IU]/mL
Prolactin: 10.2 ng/mL (ref 4.8–23.3)

## 2016-02-09 LAB — LIPID PANEL
CHOL/HDL RATIO: 1.7 ratio (ref 0.0–4.4)
CHOLESTEROL TOTAL: 137 mg/dL (ref 100–199)
HDL: 80 mg/dL (ref >39)
LDL CALC: 46 mg/dL (ref 0–99)
Triglycerides: 57 mg/dL (ref 0–149)
VLDL CHOLESTEROL CAL: 11 mg/dL (ref 5–40)

## 2016-02-17 ENCOUNTER — Ambulatory Visit (INDEPENDENT_AMBULATORY_CARE_PROVIDER_SITE_OTHER): Payer: 59 | Admitting: Family

## 2016-02-17 ENCOUNTER — Encounter: Payer: Self-pay | Admitting: Family

## 2016-02-17 VITALS — BP 118/71 | HR 67 | Temp 97.3°F | Ht 63.0 in | Wt 153.1 lb

## 2016-02-17 DIAGNOSIS — Z0000 Encounter for general adult medical examination without abnormal findings: Secondary | ICD-10-CM | POA: Diagnosis not present

## 2016-02-17 DIAGNOSIS — Z713 Dietary counseling and surveillance: Secondary | ICD-10-CM

## 2016-02-17 DIAGNOSIS — Z01419 Encounter for gynecological examination (general) (routine) without abnormal findings: Secondary | ICD-10-CM | POA: Diagnosis not present

## 2016-02-17 DIAGNOSIS — E663 Overweight: Secondary | ICD-10-CM

## 2016-02-17 MED ORDER — PHENTERMINE HCL 37.5 MG PO CAPS: 37.5000 mg | ORAL_CAPSULE | ORAL | 2 refills | Status: DC

## 2016-02-17 NOTE — Progress Notes (Signed)
   Subjective:    Patient ID: Jennifer Parsons, female    DOB: 07-05-1976, 40 y.o.   MRN: [id number]  Pt presents to the office today for CPE with pap. PT currently not taking medications at this time. Pt denies any headache, palpitations, SOB, or edema at this time.  Gynecologic Exam  The patient's pertinent negatives include no genital lesions, genital odor or missed menses. This is a chronic problem. The current episode started more than 1 year ago. The problem has been unchanged. The patient is experiencing no pain. Pertinent negatives include no headaches.      Review of Systems  Constitutional: Negative.   HENT: Negative.   Eyes: Negative.   Respiratory: Negative.  Negative for shortness of breath.   Cardiovascular: Negative.  Negative for palpitations.  Gastrointestinal: Negative.   Endocrine: Negative.   Genitourinary: Negative.  Negative for missed menses.  Musculoskeletal: Negative.   Neurological: Negative.  Negative for headaches.  Hematological: Negative.   Psychiatric/Behavioral: Negative.   All other systems reviewed and are negative.      Objective:   Physical Exam  Constitutional: She is oriented to person, place, and time. She appears well-developed and well-nourished. No distress.  HENT:  Head: Normocephalic and atraumatic.  Right Ear: External ear normal.  Left Ear: External ear normal.  Nose: Nose normal.  Mouth/Throat: Oropharynx is clear and moist.  Eyes: Pupils are equal, round, and reactive to light.  Neck: Normal range of motion. Neck supple. No thyromegaly present.  Cardiovascular: Normal rate, regular rhythm, normal heart sounds and intact distal pulses.   No murmur heard. Pulmonary/Chest: Effort normal and breath sounds normal. No respiratory distress. She has no wheezes. Right breast exhibits no inverted nipple, no mass, no nipple discharge, no skin change and no tenderness. Left breast exhibits no inverted nipple, no mass, no nipple discharge, no  skin change and no tenderness. Breasts are symmetrical.  Abdominal: Soft. Bowel sounds are normal. She exhibits no distension. There is no tenderness.  Genitourinary: Vagina normal. No vaginal discharge found.  Genitourinary Comments: Bimanual exam- no adnexal masses or tenderness, ovaries nonpalpable   Cervix parous and pink- No discharge   Musculoskeletal: Normal range of motion. She exhibits no edema or tenderness.  Neurological: She is alert and oriented to person, place, and time. She has normal reflexes. No cranial nerve deficit.  Skin: Skin is warm and dry.  Psychiatric: She has a normal mood and affect. Her behavior is normal. Judgment and thought content normal.  Vitals reviewed.     BP 118/71   Pulse 67   Temp 97.3 F (36.3 C) (Oral)   Ht 5\' 3"  (1.6 m)   Wt 153 lb 2 oz (69.5 kg)   LMP 01/27/2016   BMI 27.12 kg/m      Assessment & Plan:  1. Overweight (BMI 25.0-29.9) -Encouraged diet and exercise - phentermine 37.5 MG capsule; Take 1 capsule (37.5 mg total) by mouth every morning.  Dispense: 30 capsule; Refill: 2  2. Annual physical exam - Pap IG w/ reflex to HPV when ASC-U  3. Encounter for routine gynecological examination - Pap IG w/ reflex to HPV when ASC-U  4. Weight loss counseling, encounter for - phentermine 37.5 MG capsule; Take 1 capsule (37.5 mg total) by mouth every morning.  Dispense: 30 capsule; Refill: 2   Continue all meds Labs pending Health Maintenance reviewed Diet and exercise encouraged RTO 1 year   Evelina Dun, FNP

## 2016-02-17 NOTE — Patient Instructions (Signed)
Health Maintenance, Female Adopting a healthy lifestyle and getting preventive care can go a long way to promote health and wellness. Talk with your health care provider about what schedule of regular examinations is right for you. This is a good chance for you to check in with your provider about disease prevention and staying healthy. In between checkups, there are plenty of things you can do on your own. Experts have done a lot of research about which lifestyle changes and preventive measures are most likely to keep you healthy. Ask your health care provider for more information. WEIGHT AND DIET  Eat a healthy diet  Be sure to include plenty of vegetables, fruits, low-fat dairy products, and lean protein.  Do not eat a lot of foods high in solid fats, added sugars, or salt.  Get regular exercise. This is one of the most important things you can do for your health.  Most adults should exercise for at least 150 minutes each week. The exercise should increase your heart rate and make you sweat (moderate-intensity exercise).  Most adults should also do strengthening exercises at least twice a week. This is in addition to the moderate-intensity exercise.  Maintain a healthy weight  Body mass index (BMI) is a measurement that can be used to identify possible weight problems. It estimates body fat based on height and weight. Your health care provider can help determine your BMI and help you achieve or maintain a healthy weight.  For females 20 years of age and older:   A BMI below 18.5 is considered underweight.  A BMI of 18.5 to 24.9 is normal.  A BMI of 25 to 29.9 is considered overweight.  A BMI of 30 and above is considered obese.  Watch levels of cholesterol and blood lipids  You should start having your blood tested for lipids and cholesterol at 39 years of age, then have this test every 5 years.  You may need to have your cholesterol levels checked more often if:  Your lipid  or cholesterol levels are high.  You are older than 39 years of age.  You are at high risk for heart disease.  CANCER SCREENING   Lung Cancer  Lung cancer screening is recommended for adults 55-80 years old who are at high risk for lung cancer because of a history of smoking.  A yearly low-dose CT scan of the lungs is recommended for people who:  Currently smoke.  Have quit within the past 15 years.  Have at least a 30-pack-year history of smoking. A pack year is smoking an average of one pack of cigarettes a day for 1 year.  Yearly screening should continue until it has been 15 years since you quit.  Yearly screening should stop if you develop a health problem that would prevent you from having lung cancer treatment.  Breast Cancer  Practice breast self-awareness. This means understanding how your breasts normally appear and feel.  It also means doing regular breast self-exams. Let your health care provider know about any changes, no matter how small.  If you are in your 20s or 30s, you should have a clinical breast exam (CBE) by a health care provider every 1-3 years as part of a regular health exam.  If you are 40 or older, have a CBE every year. Also consider having a breast X-ray (mammogram) every year.  If you have a family history of breast cancer, talk to your health care provider about genetic screening.  If you   are at high risk for breast cancer, talk to your health care provider about having an MRI and a mammogram every year.  Breast cancer gene (BRCA) assessment is recommended for women who have family members with BRCA-related cancers. BRCA-related cancers include:  Breast.  Ovarian.  Tubal.  Peritoneal cancers.  Results of the assessment will determine the need for genetic counseling and BRCA1 and BRCA2 testing. Cervical Cancer Your health care provider may recommend that you be screened regularly for cancer of the pelvic organs (ovaries, uterus, and  vagina). This screening involves a pelvic examination, including checking for microscopic changes to the surface of your cervix (Pap test). You may be encouraged to have this screening done every 3 years, beginning at age 21.  For women ages 30-65, health care providers may recommend pelvic exams and Pap testing every 3 years, or they may recommend the Pap and pelvic exam, combined with testing for human papilloma virus (HPV), every 5 years. Some types of HPV increase your risk of cervical cancer. Testing for HPV may also be done on women of any age with unclear Pap test results.  Other health care providers may not recommend any screening for nonpregnant women who are considered low risk for pelvic cancer and who do not have symptoms. Ask your health care provider if a screening pelvic exam is right for you.  If you have had past treatment for cervical cancer or a condition that could lead to cancer, you need Pap tests and screening for cancer for at least 20 years after your treatment. If Pap tests have been discontinued, your risk factors (such as having a new sexual partner) need to be reassessed to determine if screening should resume. Some women have medical problems that increase the chance of getting cervical cancer. In these cases, your health care provider may recommend more frequent screening and Pap tests. Colorectal Cancer  This type of cancer can be detected and often prevented.  Routine colorectal cancer screening usually begins at 39 years of age and continues through 39 years of age.  Your health care provider may recommend screening at an earlier age if you have risk factors for colon cancer.  Your health care provider may also recommend using home test kits to check for hidden blood in the stool.  A small camera at the end of a tube can be used to examine your colon directly (sigmoidoscopy or colonoscopy). This is done to check for the earliest forms of colorectal  cancer.  Routine screening usually begins at age 50.  Direct examination of the colon should be repeated every 5-10 years through 39 years of age. However, you may need to be screened more often if early forms of precancerous polyps or small growths are found. Skin Cancer  Check your skin from head to toe regularly.  Tell your health care provider about any new moles or changes in moles, especially if there is a change in a mole's shape or color.  Also tell your health care provider if you have a mole that is larger than the size of a pencil eraser.  Always use sunscreen. Apply sunscreen liberally and repeatedly throughout the day.  Protect yourself by wearing long sleeves, pants, a wide-brimmed hat, and sunglasses whenever you are outside. HEART DISEASE, DIABETES, AND HIGH BLOOD PRESSURE   High blood pressure causes heart disease and increases the risk of stroke. High blood pressure is more likely to develop in:  People who have blood pressure in the high end   of the normal range (130-139/85-89 mm Hg).  People who are overweight or obese.  People who are African American.  If you are 38-23 years of age, have your blood pressure checked every 3-5 years. If you are 61 years of age or older, have your blood pressure checked every year. You should have your blood pressure measured twice--once when you are at a hospital or clinic, and once when you are not at a hospital or clinic. Record the average of the two measurements. To check your blood pressure when you are not at a hospital or clinic, you can use:  An automated blood pressure machine at a pharmacy.  A home blood pressure monitor.  If you are between 45 years and 39 years old, ask your health care provider if you should take aspirin to prevent strokes.  Have regular diabetes screenings. This involves taking a blood sample to check your fasting blood sugar level.  If you are at a normal weight and have a low risk for diabetes,  have this test once every three years after 39 years of age.  If you are overweight and have a high risk for diabetes, consider being tested at a younger age or more often. PREVENTING INFECTION  Hepatitis B  If you have a higher risk for hepatitis B, you should be screened for this virus. You are considered at high risk for hepatitis B if:  You were born in a country where hepatitis B is common. Ask your health care provider which countries are considered high risk.  Your parents were born in a high-risk country, and you have not been immunized against hepatitis B (hepatitis B vaccine).  You have HIV or AIDS.  You use needles to inject street drugs.  You live with someone who has hepatitis B.  You have had sex with someone who has hepatitis B.  You get hemodialysis treatment.  You take certain medicines for conditions, including cancer, organ transplantation, and autoimmune conditions. Hepatitis C  Blood testing is recommended for:  Everyone born from [birth year] through 1964.  Anyone with known risk factors for hepatitis C. Sexually transmitted infections (STIs)  You should be screened for sexually transmitted infections (STIs) including gonorrhea and chlamydia if:  You are sexually active and are younger than 39 years of age.  You are older than 39 years of age and your health care provider tells you that you are at risk for this type of infection.  Your sexual activity has changed since you were last screened and you are at an increased risk for chlamydia or gonorrhea. Ask your health care provider if you are at risk.  If you do not have HIV, but are at risk, it may be recommended that you take a prescription medicine daily to prevent HIV infection. This is called pre-exposure prophylaxis (PrEP). You are considered at risk if:  You are sexually active and do not regularly use condoms or know the HIV status of your partner(s).  You take drugs by injection.  You are sexually  active with a partner who has HIV. Talk with your health care provider about whether you are at high risk of being infected with HIV. If you choose to begin PrEP, you should first be tested for HIV. You should then be tested every 3 months for as long as you are taking PrEP.  PREGNANCY   If you are premenopausal and you may become pregnant, ask your health care provider about preconception counseling.  If you may  become pregnant, take 400 to 800 micrograms (mcg) of folic acid every day.  If you want to prevent pregnancy, talk to your health care provider about birth control (contraception). OSTEOPOROSIS AND MENOPAUSE   Osteoporosis is a disease in which the bones lose minerals and strength with aging. This can result in serious bone fractures. Your risk for osteoporosis can be identified using a bone density scan.  If you are 31 years of age or older, or if you are at risk for osteoporosis and fractures, ask your health care provider if you should be screened.  Ask your health care provider whether you should take a calcium or vitamin D supplement to lower your risk for osteoporosis.  Menopause may have certain physical symptoms and risks.  Hormone replacement therapy may reduce some of these symptoms and risks. Talk to your health care provider about whether hormone replacement therapy is right for you.  HOME CARE INSTRUCTIONS   Schedule regular health, dental, and eye exams.  Stay current with your immunizations.   Do not use any tobacco products including cigarettes, chewing tobacco, or electronic cigarettes.  If you are pregnant, do not drink alcohol.  If you are breastfeeding, limit how much and how often you drink alcohol.  Limit alcohol intake to no more than 1 drink per day for nonpregnant women. One drink equals 12 ounces of beer, 5 ounces of wine, or 1 ounces of hard liquor.  Do not use street drugs.  Do not share needles.  Ask your health care provider for help if  you need support or information about quitting drugs.  Tell your health care provider if you often feel depressed.  Tell your health care provider if you have ever been abused or do not feel safe at home.   This information is not intended to replace advice given to you by your health care provider. Make sure you discuss any questions you have with your health care provider.   Document Released: 01/09/2011 Document Revised: 07/17/2014 Document Reviewed: 05/28/2013 Elsevier Interactive Patient Education [employer].

## 2016-02-21 LAB — PAP IG W/ RFLX HPV ASCU: PAP SMEAR COMMENT: 0

## 2016-06-20 ENCOUNTER — Ambulatory Visit (INDEPENDENT_AMBULATORY_CARE_PROVIDER_SITE_OTHER): Payer: 59

## 2016-06-20 DIAGNOSIS — Z23 Encounter for immunization: Secondary | ICD-10-CM

## 2016-08-08 ENCOUNTER — Other Ambulatory Visit: Payer: Self-pay | Admitting: Family

## 2016-08-08 MED ORDER — OSELTAMIVIR PHOSPHATE 75 MG PO CAPS
75.0000 mg | ORAL_CAPSULE | Freq: Two times a day (BID) | ORAL | 0 refills | Status: DC
Start: 2016-08-08 — End: 2016-11-08

## 2016-09-21 ENCOUNTER — Other Ambulatory Visit: Payer: Self-pay | Admitting: *Deleted

## 2016-09-21 DIAGNOSIS — Z1211 Encounter for screening for malignant neoplasm of colon: Secondary | ICD-10-CM

## 2016-11-08 ENCOUNTER — Ambulatory Visit (INDEPENDENT_AMBULATORY_CARE_PROVIDER_SITE_OTHER): Payer: 59 | Admitting: Family Medicine

## 2016-11-08 ENCOUNTER — Encounter: Payer: Self-pay | Admitting: Family Medicine

## 2016-11-08 VITALS — BP 98/56 | HR 62 | Ht 63.0 in | Wt 159.4 lb

## 2016-11-08 DIAGNOSIS — R5383 Other fatigue: Secondary | ICD-10-CM | POA: Diagnosis not present

## 2016-11-08 DIAGNOSIS — R232 Flushing: Secondary | ICD-10-CM

## 2016-11-08 DIAGNOSIS — E663 Overweight: Secondary | ICD-10-CM | POA: Diagnosis not present

## 2016-11-08 DIAGNOSIS — Z713 Dietary counseling and surveillance: Secondary | ICD-10-CM

## 2016-11-08 MED ORDER — PHENTERMINE HCL 37.5 MG PO CAPS: 37.5000 mg | ORAL_CAPSULE | ORAL | 2 refills | Status: DC

## 2016-11-08 NOTE — Progress Notes (Signed)
Subjective:  Patient ID: Jennifer Parsons, female    DOB: Jan 16, 1976  Age: 41 y.o. MRN: [id number]  CC: Weight Gain   HPI DARLINE FAITH presents for 4 months of gradual weight gain. She is put on about 10 pounds this is been rather unusual for her in the past. She feels that her energy is following somewhat. She's experienced some cold intolerance. She said she is always cold. This is why she wears her jacket at work. She denies hair loss and constipation. She does have a strong family history for thyroid disease in multiple relatives. There is a strong family history of obesity as well.  History Jennifer Parsons has a past medical history of Fibrocystic breast and GERD (gastroesophageal reflux disease).   She has a past surgical history that includes Tubal ligation; Wisdom tooth extraction; and Esophageal dilation.   Her family history includes Breast cancer in her maternal aunt and paternal aunt; COPD in her father; Diabetes in her paternal grandmother; Esophageal cancer in her paternal grandfather; Heart disease in her maternal grandfather and maternal grandmother; Hypertension in her brother and sister; Ovarian cancer in her paternal aunt; Stomach cancer in her maternal grandfather; Uterine cancer in her mother.She reports that she has been smoking.  She has been smoking about 0.25 packs per day. She has never used smokeless tobacco. She reports that she drinks alcohol. She reports that she does not use drugs.  Current Outpatient Prescriptions on File Prior to Visit  Medication Sig Dispense Refill  . EPINEPHrine (EPIPEN 2-PAK) 0.3 mg/0.3 mL DEVI Inject into the muscle once.     No current facility-administered medications on file prior to visit.     ROS Review of Systems  Constitutional: Positive for fatigue. Negative for activity change, appetite change and fever.  HENT: Negative for congestion, rhinorrhea and sore throat.   Respiratory: Negative for cough and shortness of breath.     Cardiovascular: Negative for palpitations.  Gastrointestinal: Negative for abdominal pain, diarrhea and nausea.  Endocrine: Positive for cold intolerance. Negative for heat intolerance.  Genitourinary: Negative for dysuria.  Musculoskeletal: Negative for arthralgias and myalgias.    Objective:  BP (!) 98/56   Pulse 62   Ht '5\' 3"'$  (1.6 m)   Wt 159 lb 6 oz (72.3 kg)   BMI 28.23 kg/m   Physical Exam  Constitutional: She is oriented to person, place, and time. She appears well-developed and well-nourished. No distress.  HENT:  Head: Normocephalic and atraumatic.  Eyes: Conjunctivae are normal. Pupils are equal, round, and reactive to light.  Neck: Normal range of motion. Neck supple. No thyromegaly present.  Cardiovascular: Normal rate, regular rhythm and normal heart sounds.   No murmur heard. Pulmonary/Chest: Effort normal and breath sounds normal. No respiratory distress. She has no wheezes. She has no rales.  Abdominal: Soft. Bowel sounds are normal. She exhibits no distension. There is no tenderness.  Musculoskeletal: Normal range of motion.  Lymphadenopathy:    She has no cervical adenopathy.  Neurological: She is alert and oriented to person, place, and time.  Skin: Skin is warm and dry.  Psychiatric: She has a normal mood and affect. Her behavior is normal. Judgment and thought content normal.    Assessment & Plan:   Jennifer Parsons was seen today for weight gain.  Diagnoses and all orders for this visit:  Overweight (BMI 25.0-29.9) -     CBC with Differential/Platelet -     CMP14+EGFR -     Thyroid Panel With TSH -  phentermine 37.5 MG capsule; Take 1 capsule (37.5 mg total) by mouth every morning.  Weight loss counseling, encounter for -     CBC with Differential/Platelet -     CMP14+EGFR -     Thyroid Panel With TSH -     phentermine 37.5 MG capsule; Take 1 capsule (37.5 mg total) by mouth every morning.  Hot flashes -     CBC with Differential/Platelet -      CMP14+EGFR -     Thyroid Panel With TSH -     FSH/LH  Fatigue, unspecified type -     CBC with Differential/Platelet -     CMP14+EGFR -     Thyroid Panel With TSH   I have discontinued [name]'s phentermine and oseltamivir. I am also having her start on phentermine. Additionally, I am having her maintain her EPINEPHrine.  Meds ordered this encounter  Medications  . phentermine 37.5 MG capsule    Sig: Take 1 capsule (37.5 mg total) by mouth every morning.    Dispense:  30 capsule    Refill:  2     Follow-up: Return if symptoms worsen or fail to improve.  Claretta Fraise, M.D.

## 2016-11-09 LAB — CBC WITH DIFFERENTIAL/PLATELET
BASOS: 1 %
Basophils Absolute: 0.1 10*3/uL (ref 0.0–0.2)
EOS (ABSOLUTE): 0.5 10*3/uL — AB (ref 0.0–0.4)
Eos: 5 %
HEMOGLOBIN: 14.3 g/dL (ref 11.1–15.9)
Hematocrit: 42.9 % (ref 34.0–46.6)
IMMATURE GRANS (ABS): 0 10*3/uL (ref 0.0–0.1)
Immature Granulocytes: 0 %
LYMPHS ABS: 4.1 10*3/uL — AB (ref 0.7–3.1)
LYMPHS: 43 %
MCH: 31.2 pg (ref 26.6–33.0)
MCHC: 33.3 g/dL (ref 31.5–35.7)
MCV: 94 fL (ref 79–97)
MONOCYTES: 9 %
Monocytes Absolute: 0.8 10*3/uL (ref 0.1–0.9)
NEUTROS ABS: 4 10*3/uL (ref 1.4–7.0)
Neutrophils: 42 %
Platelets: 291 10*3/uL (ref 150–379)
RBC: 4.58 x10E6/uL (ref 3.77–5.28)
RDW: 12.9 % (ref 12.3–15.4)
WBC: 9.5 10*3/uL (ref 3.4–10.8)

## 2016-11-09 LAB — THYROID PANEL WITH TSH
Free Thyroxine Index: 1.7 (ref 1.2–4.9)
T3 UPTAKE RATIO: 27 % (ref 24–39)
T4 TOTAL: 6.3 ug/dL (ref 4.5–12.0)
TSH: 2.04 u[IU]/mL (ref 0.450–4.500)

## 2016-11-09 LAB — CMP14+EGFR
A/G RATIO: 1.4 (ref 1.2–2.2)
ALBUMIN: 4 g/dL (ref 3.5–5.5)
ALT: 16 IU/L (ref 0–32)
AST: 18 IU/L (ref 0–40)
Alkaline Phosphatase: 90 IU/L (ref 39–117)
BUN / CREAT RATIO: 12 (ref 9–23)
BUN: 11 mg/dL (ref 6–20)
CO2: 27 mmol/L (ref 18–29)
Calcium: 9.3 mg/dL (ref 8.7–10.2)
Chloride: 99 mmol/L (ref 96–106)
Creatinine, Ser: 0.91 mg/dL (ref 0.57–1.00)
GFR calc Af Amer: 92 mL/min/{1.73_m2} (ref >59)
GFR calc non Af Amer: 80 mL/min/{1.73_m2} (ref >59)
Globulin, Total: 2.9 g/dL (ref 1.5–4.5)
Glucose: 87 mg/dL (ref 65–99)
POTASSIUM: 4.3 mmol/L (ref 3.5–5.2)
SODIUM: 140 mmol/L (ref 134–144)
TOTAL PROTEIN: 6.9 g/dL (ref 6.0–8.5)

## 2016-11-09 LAB — FSH/LH
FSH: 16.8 m[IU]/mL
LH: 52.9 m[IU]/mL

## 2016-11-09 MED ORDER — EPINEPHRINE 0.3 MG/0.3ML IJ SOAJ: 0.3000 mg | Freq: Once | INTRAMUSCULAR | 0 refills | Status: AC

## 2016-11-09 NOTE — Addendum Note (Signed)
Addended by: Marylin Crosby on: 11/09/2016 01:33 PM   Modules accepted: Orders

## 2017-01-18 ENCOUNTER — Other Ambulatory Visit: Payer: 59

## 2017-01-18 ENCOUNTER — Other Ambulatory Visit: Payer: Self-pay | Admitting: Family Medicine

## 2017-01-20 ENCOUNTER — Other Ambulatory Visit: Payer: Self-pay | Admitting: Family Medicine

## 2017-01-20 LAB — FECAL OCCULT BLOOD, IMMUNOCHEMICAL: Fecal Occult Bld: NEGATIVE

## 2017-01-22 NOTE — Telephone Encounter (Signed)
Phoned in.

## 2017-02-15 ENCOUNTER — Other Ambulatory Visit: Payer: Self-pay | Admitting: Family

## 2017-02-15 ENCOUNTER — Other Ambulatory Visit: Payer: 59

## 2017-02-15 DIAGNOSIS — K219 Gastro-esophageal reflux disease without esophagitis: Secondary | ICD-10-CM

## 2017-02-15 DIAGNOSIS — Z0000 Encounter for general adult medical examination without abnormal findings: Secondary | ICD-10-CM

## 2017-02-15 LAB — BAYER DCA HB A1C WAIVED: HB A1C: 4.9 % (ref <7.0)

## 2017-02-16 LAB — CMP14+EGFR
A/G RATIO: 1.7 (ref 1.2–2.2)
ALT: 11 IU/L (ref 0–32)
AST: 21 IU/L (ref 0–40)
Albumin: 4.2 g/dL (ref 3.5–5.5)
Alkaline Phosphatase: 90 IU/L (ref 39–117)
BILIRUBIN TOTAL: 0.3 mg/dL (ref 0.0–1.2)
BUN/Creatinine Ratio: 13 (ref 9–23)
BUN: 11 mg/dL (ref 6–24)
CHLORIDE: 101 mmol/L (ref 96–106)
CO2: 24 mmol/L (ref 20–29)
Calcium: 9 mg/dL (ref 8.7–10.2)
Creatinine, Ser: 0.86 mg/dL (ref 0.57–1.00)
GFR calc non Af Amer: 85 mL/min/{1.73_m2} (ref >59)
GFR, EST AFRICAN AMERICAN: 98 mL/min/{1.73_m2} (ref >59)
GLOBULIN, TOTAL: 2.5 g/dL (ref 1.5–4.5)
Glucose: 71 mg/dL (ref 65–99)
POTASSIUM: 4.2 mmol/L (ref 3.5–5.2)
SODIUM: 141 mmol/L (ref 134–144)
TOTAL PROTEIN: 6.7 g/dL (ref 6.0–8.5)

## 2017-02-16 LAB — THYROID PANEL WITH TSH
FREE THYROXINE INDEX: 1.9 (ref 1.2–4.9)
T3 UPTAKE RATIO: 27 % (ref 24–39)
T4, Total: 6.9 ug/dL (ref 4.5–12.0)
TSH: 2.45 u[IU]/mL (ref 0.450–4.500)

## 2017-02-16 LAB — LIPID PANEL
Chol/HDL Ratio: 1.8 ratio (ref 0.0–4.4)
Cholesterol, Total: 143 mg/dL (ref 100–199)
HDL: 79 mg/dL (ref >39)
LDL Calculated: 50 mg/dL (ref 0–99)
Triglycerides: 70 mg/dL (ref 0–149)
VLDL Cholesterol Cal: 14 mg/dL (ref 5–40)

## 2017-02-16 LAB — ANEMIA PROFILE B
BASOS: 1 %
Basophils Absolute: 0 10*3/uL (ref 0.0–0.2)
EOS (ABSOLUTE): 0.3 10*3/uL (ref 0.0–0.4)
Eos: 5 %
FERRITIN: 53 ng/mL (ref 15–150)
Folate: 5.4 ng/mL (ref >3.0)
HEMATOCRIT: 46.7 % — AB (ref 34.0–46.6)
Hemoglobin: 15.4 g/dL (ref 11.1–15.9)
IRON SATURATION: 32 % (ref 15–55)
Immature Grans (Abs): 0 10*3/uL (ref 0.0–0.1)
Immature Granulocytes: 0 %
Iron: 96 ug/dL (ref 27–159)
Lymphocytes Absolute: 2.9 10*3/uL (ref 0.7–3.1)
Lymphs: 43 %
MCH: 32 pg (ref 26.6–33.0)
MCHC: 33 g/dL (ref 31.5–35.7)
MCV: 97 fL (ref 79–97)
MONOS ABS: 0.6 10*3/uL (ref 0.1–0.9)
Monocytes: 9 %
NEUTROS ABS: 2.8 10*3/uL (ref 1.4–7.0)
Neutrophils: 42 %
Platelets: 271 10*3/uL (ref 150–379)
RBC: 4.82 x10E6/uL (ref 3.77–5.28)
RDW: 13.6 % (ref 12.3–15.4)
Retic Ct Pct: 1.2 % (ref 0.6–2.6)
Total Iron Binding Capacity: 300 ug/dL (ref 250–450)
UIBC: 204 ug/dL (ref 131–425)
VITAMIN B 12: 295 pg/mL (ref 232–1245)
WBC: 6.8 10*3/uL (ref 3.4–10.8)

## 2017-02-16 LAB — VITAMIN D 25 HYDROXY (VIT D DEFICIENCY, FRACTURES): Vit D, 25-Hydroxy: 35.3 ng/mL (ref 30.0–100.0)

## 2017-04-23 ENCOUNTER — Other Ambulatory Visit: Payer: Self-pay | Admitting: Family Medicine

## 2017-05-19 ENCOUNTER — Other Ambulatory Visit: Payer: Self-pay | Admitting: Family

## 2017-05-21 NOTE — Telephone Encounter (Signed)
Pt aware NTBS 

## 2017-06-01 ENCOUNTER — Other Ambulatory Visit: Payer: Self-pay | Admitting: Nurse Practitioner

## 2017-06-01 ENCOUNTER — Ambulatory Visit: Payer: 59

## 2017-06-01 ENCOUNTER — Ambulatory Visit (INDEPENDENT_AMBULATORY_CARE_PROVIDER_SITE_OTHER): Payer: 59

## 2017-06-01 DIAGNOSIS — R109 Unspecified abdominal pain: Secondary | ICD-10-CM

## 2017-06-04 ENCOUNTER — Encounter: Payer: Self-pay | Admitting: Nurse Practitioner

## 2017-06-04 ENCOUNTER — Ambulatory Visit: Payer: 59 | Admitting: Nurse Practitioner

## 2017-06-04 DIAGNOSIS — R1012 Left upper quadrant pain: Secondary | ICD-10-CM

## 2017-06-04 MED ORDER — CYCLOBENZAPRINE HCL 10 MG PO TABS: 10.0000 mg | ORAL_TABLET | Freq: Three times a day (TID) | ORAL | 1 refills | Status: DC | PRN

## 2017-06-04 NOTE — Progress Notes (Signed)
   Subjective:    Patient ID: Jennifer Parsons, female    DOB: [birth year], 41 y.o.   MRN: [id number]  HPI  Patient comes in today worried about her health. She has been having left flank pain for over a week.. She had a kub done on 06/01/17. Report says there is a concern of possible renal calculi but was otherwise negative. Her urine is clear. Pain currently is 10/10 intermittently. Certain movements will cause a grabbing pain rated 10/10. If she can sit still pain will go to ache. The pain is causing her to not sleep at night.   Review of Systems  Constitutional: Negative for activity change and appetite change.  HENT: Negative.   Eyes: Negative for pain.  Respiratory: Negative for shortness of breath.   Cardiovascular: Negative for chest pain, palpitations and leg swelling.  Gastrointestinal: Negative for abdominal pain.  Endocrine: Negative for polydipsia.  Genitourinary: Negative.   Musculoskeletal: Positive for back pain (left mid back).  Skin: Negative for rash.  Neurological: Negative for dizziness, weakness and headaches.  Hematological: Does not bruise/bleed easily.  Psychiatric/Behavioral: Negative.   All other systems reviewed and are negative.      Objective:   Physical Exam  Constitutional: She is oriented to person, place, and time. She appears well-developed and well-nourished. No distress.  Cardiovascular: Normal rate and regular rhythm.  Pulmonary/Chest: Effort normal and breath sounds normal.  Abdominal: Soft. Bowel sounds are normal. She exhibits no distension and no mass. There is no tenderness. There is no rebound and no guarding.  Genitourinary:  Genitourinary Comments: Left CVA tenderness Guarding when pressing left kidney  Neurological: She is alert and oriented to person, place, and time.  Skin: Skin is warm.  Psychiatric: She has a normal mood and affect. Her behavior is normal. Judgment and thought content normal.    BP 120/76   Pulse (!) 57   Temp  (!) 97.3 F (36.3 C) (Oral)   Ht '5\' 3"'$  (1.6 m)   Wt 150 lb (68 kg)   BMI 26.57 kg/m        Assessment & Plan:   1. Left upper quadrant pain    Orders Placed This Encounter  Procedures  . CT ABDOMEN W CONTRAST    Standing Status:   Future    Standing Expiration Date:   09/04/2018    Order Specific Question:   If indicated for the ordered procedure, I authorize the administration of contrast media per Radiology protocol    Answer:   Yes    Order Specific Question:   Is patient pregnant?    Answer:   No    Order Specific Question:   Preferred imaging location?    Answer:   [facility]    Order Specific Question:   Radiology Contrast Protocol - do NOT remove file path    Answer:   [email or web address]  . CMP14+EGFR    Meds ordered this encounter  Medications  . cyclobenzaprine (FLEXERIL) 10 MG tablet    Sig: Take 1 tablet (10 mg total) by mouth 3 (three) times daily as needed for muscle spasms.    Dispense:  30 tablet    Refill:  1    Order Specific Question:   Supervising Provider    Answer:   Evette Doffing, CAROL L [[id number]]   Moist heat to flank Rest Will talk once ct results are back  [place], FNP

## 2017-06-04 NOTE — Patient Instructions (Signed)
Flank Pain Flank pain is pain in your side. The flank is the area of your side between your upper belly (abdomen) and your back. The pain may occur over a short period of time (acute) or may be long-term or come back often (chronic). It may be mild or very bad. Pain in this area can be caused by many different things. Follow these instructions at home:  Rest as told by your doctor.  Drink enough fluid to keep your pee (urine) clear or pale yellow.  Take over-the-counter and prescription medicines only as told by your doctor.  Keep all follow-up visits as told by your doctor. This is important. Contact a doctor if:  Medicine does not help your pain.  You have new symptoms.  Your pain gets worse.  You have a fever.  Your symptoms last longer than 2-3 days. Get help right away if:  Your tummy hurts or is swollen.  You are short of breath.  You feel sick to your stomach (nauseous) and it does not go away.  You cannot stop throwing up (vomiting).  You feel like you will pass out or you do pass out (faint).  You have blood in your pee.  You have a fever and your symptoms suddenly get worse. This information is not intended to replace advice given to you by your health care provider. Make sure you discuss any questions you have with your health care provider. Document Released: 04/04/2008 Document Revised: 03/17/2016 Document Reviewed: 03/30/2015 Elsevier Interactive Patient Education  2018 Elsevier Inc[employer]  

## 2017-06-05 ENCOUNTER — Other Ambulatory Visit: Payer: Self-pay | Admitting: Nurse Practitioner

## 2017-06-05 LAB — CMP14+EGFR
A/G RATIO: 1.5 (ref 1.2–2.2)
ALT: 28 IU/L (ref 0–32)
AST: 28 IU/L (ref 0–40)
Albumin: 4.1 g/dL (ref 3.5–5.5)
Alkaline Phosphatase: 82 IU/L (ref 39–117)
BILIRUBIN TOTAL: 0.3 mg/dL (ref 0.0–1.2)
BUN/Creatinine Ratio: 11 (ref 9–23)
BUN: 9 mg/dL (ref 6–24)
CALCIUM: 9.4 mg/dL (ref 8.7–10.2)
CO2: 27 mmol/L (ref 20–29)
Chloride: 102 mmol/L (ref 96–106)
Creatinine, Ser: 0.8 mg/dL (ref 0.57–1.00)
GFR, EST AFRICAN AMERICAN: 107 mL/min/{1.73_m2} (ref >59)
GFR, EST NON AFRICAN AMERICAN: 93 mL/min/{1.73_m2} (ref >59)
GLOBULIN, TOTAL: 2.8 g/dL (ref 1.5–4.5)
Glucose: 89 mg/dL (ref 65–99)
POTASSIUM: 4.2 mmol/L (ref 3.5–5.2)
SODIUM: 139 mmol/L (ref 134–144)
TOTAL PROTEIN: 6.9 g/dL (ref 6.0–8.5)

## 2017-06-07 ENCOUNTER — Other Ambulatory Visit: Payer: Self-pay | Admitting: Nurse Practitioner

## 2017-06-07 DIAGNOSIS — R109 Unspecified abdominal pain: Secondary | ICD-10-CM

## 2017-06-08 ENCOUNTER — Ambulatory Visit (HOSPITAL_COMMUNITY)
Admission: RE | Admit: 2017-06-08 | Discharge: 2017-06-08 | Disposition: A | Discharge status: Home / Self Care | Payer: 59 | Source: Ambulatory Visit | Attending: Nurse Practitioner | Admitting: Nurse Practitioner

## 2017-06-08 DIAGNOSIS — R109 Unspecified abdominal pain: Secondary | ICD-10-CM

## 2017-06-08 DIAGNOSIS — K769 Liver disease, unspecified: Secondary | ICD-10-CM | POA: Diagnosis not present

## 2017-06-29 ENCOUNTER — Other Ambulatory Visit: Payer: Self-pay | Admitting: Family Medicine

## 2017-06-29 MED ORDER — PHENTERMINE HCL 37.5 MG PO TABS: 37.5000 mg | ORAL_TABLET | Freq: Every morning | ORAL | 2 refills | Status: DC

## 2017-09-03 ENCOUNTER — Other Ambulatory Visit: Payer: Self-pay | Admitting: Family Medicine

## 2017-09-04 NOTE — Telephone Encounter (Signed)
Last seen 06/04/17  MMM

## 2017-11-16 ENCOUNTER — Encounter: Payer: Self-pay | Admitting: Family Medicine

## 2017-11-16 ENCOUNTER — Ambulatory Visit (INDEPENDENT_AMBULATORY_CARE_PROVIDER_SITE_OTHER): Payer: Managed Care, Other (non HMO) | Admitting: Family Medicine

## 2017-11-16 VITALS — BP 114/74 | HR 72 | Temp 98.2°F | Ht 63.0 in | Wt 154.0 lb

## 2017-11-16 DIAGNOSIS — R635 Abnormal weight gain: Secondary | ICD-10-CM | POA: Diagnosis not present

## 2017-11-16 DIAGNOSIS — R5383 Other fatigue: Secondary | ICD-10-CM

## 2017-11-16 DIAGNOSIS — J301 Allergic rhinitis due to pollen: Secondary | ICD-10-CM | POA: Diagnosis not present

## 2017-11-16 MED ORDER — PHENTERMINE HCL 37.5 MG PO TABS: 37.5000 mg | ORAL_TABLET | Freq: Every morning | ORAL | 5 refills | Status: DC

## 2017-11-16 MED ORDER — TOPIRAMATE 25 MG PO TABS: 25.0000 mg | ORAL_TABLET | Freq: Every day | ORAL | 5 refills | Status: DC

## 2017-11-16 MED ORDER — FEXOFENADINE-PSEUDOEPHED ER 180-240 MG PO TB24: 1.0000 | ORAL_TABLET | Freq: Every day | ORAL | 3 refills | Status: DC

## 2017-11-16 NOTE — Progress Notes (Signed)
Subjective:  Patient ID: Jennifer Parsons, female    DOB: 1976-06-27  Age: 42 y.o. MRN: [id number]  CC: Weight Gain (pt here today to discuss weight gain)   HPI Jennifer Parsons presents for frustration over inability to lose weight.  She has up and down a bit on her weight.  We have done some blood work which has not shown any [place] reason for weight gain.  She is willing to go through further blood work as needed.  However, she would like to have some further intervention regarding weight she tried contaminant lost some weight but it came back when she quit taking it.  We did not try the topiramate additional to it however.  She states that she has poor energy.  However she also states that she is able to spread a full truckload and a half of mulch over her yard this spring.  She did not however help her husband open the pool or establish the garden.  Depression screen [facility] 2/9 06/04/2017 11/08/2016 02/17/2016  Decreased Interest 0 0 0  Down, Depressed, Hopeless 0 0 0  PHQ - 2 Score 0 0 0    History Jennifer Parsons has a past medical history of Fibrocystic breast and GERD (gastroesophageal reflux disease).   She has a past surgical history that includes Tubal ligation; Wisdom tooth extraction; and Esophageal dilation.   Her family history includes Breast cancer in her maternal aunt and paternal aunt; COPD in her father; Diabetes in her paternal grandmother; Esophageal cancer in her paternal grandfather; Heart disease in her maternal grandfather and maternal grandmother; Hypertension in her brother and sister; Ovarian cancer in her paternal aunt; Stomach cancer in her maternal grandfather; Uterine cancer in her mother.She reports that she has been smoking.  She has been smoking about 0.25 packs per day. She has never used smokeless tobacco. She reports that she drinks alcohol. She reports that she does not use drugs.    ROS Review of Systems  Constitutional: Positive for activity change (Fatigue has  caused her to decrease her activity level) and fatigue. Negative for chills, diaphoresis and fever.  HENT: Positive for congestion, postnasal drip and sneezing.   Eyes: Negative for visual disturbance.  Respiratory: Negative for shortness of breath.   Cardiovascular: Negative for chest pain.  Gastrointestinal: Negative for abdominal pain.  Musculoskeletal: Negative for arthralgias.  Allergic/Immunologic: Positive for environmental allergies.    Objective:  BP 114/74   Pulse 72   Temp 98.2 F (36.8 C) (Oral)   Ht _0  (1.6 m)   Wt 154 lb (69.9 kg)   BMI 27.28 kg/m   BP Readings from Last 3 Encounters:  11/16/17 114/74  06/04/17 120/76  11/08/16 (!) 98/56    Wt Readings from Last 3 Encounters:  11/16/17 154 lb (69.9 kg)  06/04/17 150 lb (68 kg)  11/08/16 159 lb 6 oz (72.3 kg)     Physical Exam  Constitutional: She is oriented to person, place, and time. She appears well-developed and well-nourished. No distress.  Cardiovascular: Normal rate and regular rhythm.  Pulmonary/Chest: Breath sounds normal.  Neurological: She is alert and oriented to person, place, and time.  Skin: Skin is warm and dry.  Psychiatric: She has a normal mood and affect.      Assessment & Plan:   Jennifer Parsons was seen today for weight gain.  Diagnoses and all orders for this visit:  Seasonal allergic rhinitis due to pollen  Other fatigue -     Vitamin  B12; Future -     Prolactin; Future -     Cortisol; Future -     CBC with Differential/Platelet; Future -     CMP14+EGFR; Future  Weight gain -     Vitamin B12; Future -     Prolactin; Future -     Cortisol; Future -     CBC with Differential/Platelet; Future -     CMP14+EGFR; Future  Other orders -     fexofenadine-pseudoephedrine (ALLEGRA-D ALLERGY & CONGESTION) 180-240 MG 24 hr tablet; Take 1 tablet by mouth at bedtime. -     topiramate (TOPAMAX) 25 MG tablet; Take 1 tablet (25 mg total) by mouth at bedtime. -     phentermine  (ADIPEX-P) 37.5 MG tablet; Take 1 tablet (37.5 mg total) by mouth every morning.       I have changed Jennifer Parsons's phentermine. I am also having her start on fexofenadine-pseudoephedrine and topiramate. Additionally, I am having her maintain her cyclobenzaprine.  Allergies as of 11/16/2017      Reactions   Bee Venom Anaphylaxis   Fish Allergy Anaphylaxis   Nutritional Supplements       Medication List        Accurate as of 11/16/17  9:22 PM. Always use your most recent med list.          cyclobenzaprine 10 MG tablet Commonly known as:  FLEXERIL Take 1 tablet (10 mg total) by mouth 3 (three) times daily as needed for muscle spasms.   fexofenadine-pseudoephedrine 180-240 MG 24 hr tablet Commonly known as:  ALLEGRA-D ALLERGY & CONGESTION Take 1 tablet by mouth at bedtime.   phentermine 37.5 MG tablet Commonly known as:  ADIPEX-P Take 1 tablet (37.5 mg total) by mouth every morning.   topiramate 25 MG tablet Commonly known as:  TOPAMAX Take 1 tablet (25 mg total) by mouth at bedtime.      I believe a great deal of her symptoms can be attributed to allergic rhinitis.  Believe she will respond to a trial of Allegra-D 24.  A second round of blood work has been ordered as noted above to catch any other options that might be contributing as well.  Follow-up: Return in about 3 months (around 02/16/2018).  Claretta Fraise, M.D.

## 2017-11-19 ENCOUNTER — Ambulatory Visit: Payer: Managed Care, Other (non HMO)

## 2017-11-19 NOTE — Addendum Note (Signed)
Addended by: Liliane Bade on: 11/19/2017 09:47 AM   Modules accepted: Orders

## 2017-11-20 LAB — CMP14+EGFR
ALBUMIN: 4.1 g/dL (ref 3.5–5.5)
ALT: 18 IU/L (ref 0–32)
AST: 22 IU/L (ref 0–40)
Albumin/Globulin Ratio: 1.6 (ref 1.2–2.2)
Alkaline Phosphatase: 84 IU/L (ref 39–117)
BILIRUBIN TOTAL: 0.5 mg/dL (ref 0.0–1.2)
BUN / CREAT RATIO: 11 (ref 9–23)
BUN: 11 mg/dL (ref 6–24)
CHLORIDE: 104 mmol/L (ref 96–106)
CO2: 21 mmol/L (ref 20–29)
CREATININE: 0.96 mg/dL (ref 0.57–1.00)
Calcium: 8.9 mg/dL (ref 8.7–10.2)
GFR calc Af Amer: 86 mL/min/{1.73_m2} (ref >59)
GFR calc non Af Amer: 74 mL/min/{1.73_m2} (ref >59)
GLUCOSE: 69 mg/dL (ref 65–99)
Globulin, Total: 2.6 g/dL (ref 1.5–4.5)
Potassium: 4.3 mmol/L (ref 3.5–5.2)
Sodium: 138 mmol/L (ref 134–144)
Total Protein: 6.7 g/dL (ref 6.0–8.5)

## 2017-11-20 LAB — CBC WITH DIFFERENTIAL/PLATELET
BASOS ABS: 0.1 10*3/uL (ref 0.0–0.2)
Basos: 1 %
EOS (ABSOLUTE): 0.3 10*3/uL (ref 0.0–0.4)
EOS: 5 %
Hematocrit: 42.6 % (ref 34.0–46.6)
Hemoglobin: 14.2 g/dL (ref 11.1–15.9)
IMMATURE GRANS (ABS): 0 10*3/uL (ref 0.0–0.1)
Immature Granulocytes: 0 %
LYMPHS: 34 %
Lymphocytes Absolute: 2.2 10*3/uL (ref 0.7–3.1)
MCH: 31.9 pg (ref 26.6–33.0)
MCHC: 33.3 g/dL (ref 31.5–35.7)
MCV: 96 fL (ref 79–97)
MONOCYTES: 9 %
Monocytes Absolute: 0.6 10*3/uL (ref 0.1–0.9)
Neutrophils Absolute: 3.3 10*3/uL (ref 1.4–7.0)
Neutrophils: 51 %
PLATELETS: 306 10*3/uL (ref 150–379)
RBC: 4.45 x10E6/uL (ref 3.77–5.28)
RDW: 14.2 % (ref 12.3–15.4)
WBC: 6.4 10*3/uL (ref 3.4–10.8)

## 2017-11-20 LAB — CORTISOL: CORTISOL: 11.5 ug/dL

## 2017-11-20 LAB — VITAMIN B12: Vitamin B-12: 243 pg/mL (ref 232–1245)

## 2017-11-20 LAB — PROLACTIN: PROLACTIN: 20.7 ng/mL (ref 4.8–23.3)

## 2018-02-15 ENCOUNTER — Other Ambulatory Visit: Payer: Self-pay | Admitting: Family

## 2018-02-15 ENCOUNTER — Other Ambulatory Visit: Payer: Self-pay

## 2018-02-15 DIAGNOSIS — Z1239 Encounter for other screening for malignant neoplasm of breast: Secondary | ICD-10-CM

## 2018-02-19 ENCOUNTER — Other Ambulatory Visit: Payer: Self-pay | Admitting: *Deleted

## 2018-02-19 DIAGNOSIS — R5383 Other fatigue: Secondary | ICD-10-CM

## 2018-02-19 DIAGNOSIS — Z0000 Encounter for general adult medical examination without abnormal findings: Secondary | ICD-10-CM

## 2018-02-20 ENCOUNTER — Other Ambulatory Visit: Payer: Managed Care, Other (non HMO)

## 2018-02-20 DIAGNOSIS — Z0000 Encounter for general adult medical examination without abnormal findings: Secondary | ICD-10-CM

## 2018-02-20 LAB — BAYER DCA HB A1C WAIVED: HB A1C: 5 % (ref <7.0)

## 2018-02-21 LAB — CMP14+EGFR
A/G RATIO: 1.5 (ref 1.2–2.2)
ALBUMIN: 3.7 g/dL (ref 3.5–5.5)
ALT: 17 IU/L (ref 0–32)
AST: 17 IU/L (ref 0–40)
Alkaline Phosphatase: 74 IU/L (ref 39–117)
BUN / CREAT RATIO: 16 (ref 9–23)
BUN: 14 mg/dL (ref 6–24)
CALCIUM: 8.6 mg/dL — AB (ref 8.7–10.2)
CHLORIDE: 107 mmol/L — AB (ref 96–106)
CO2: 24 mmol/L (ref 20–29)
Creatinine, Ser: 0.87 mg/dL (ref 0.57–1.00)
GFR calc Af Amer: 96 mL/min/{1.73_m2} (ref >59)
GFR, EST NON AFRICAN AMERICAN: 83 mL/min/{1.73_m2} (ref >59)
GLOBULIN, TOTAL: 2.4 g/dL (ref 1.5–4.5)
Glucose: 76 mg/dL (ref 65–99)
POTASSIUM: 4.2 mmol/L (ref 3.5–5.2)
Sodium: 142 mmol/L (ref 134–144)
Total Protein: 6.1 g/dL (ref 6.0–8.5)

## 2018-02-21 LAB — ANEMIA PROFILE B
BASOS: 1 %
Basophils Absolute: 0.1 10*3/uL (ref 0.0–0.2)
EOS (ABSOLUTE): 0.4 10*3/uL (ref 0.0–0.4)
EOS: 6 %
FERRITIN: 60 ng/mL (ref 15–150)
Folate: 6.4 ng/mL (ref >3.0)
HEMATOCRIT: 41 % (ref 34.0–46.6)
HEMOGLOBIN: 13.7 g/dL (ref 11.1–15.9)
IRON SATURATION: 25 % (ref 15–55)
IRON: 63 ug/dL (ref 27–159)
Immature Grans (Abs): 0 10*3/uL (ref 0.0–0.1)
Immature Granulocytes: 0 %
Lymphocytes Absolute: 3.4 10*3/uL — ABNORMAL HIGH (ref 0.7–3.1)
Lymphs: 47 %
MCH: 31.7 pg (ref 26.6–33.0)
MCHC: 33.4 g/dL (ref 31.5–35.7)
MCV: 95 fL (ref 79–97)
MONOCYTES: 9 %
Monocytes Absolute: 0.7 10*3/uL (ref 0.1–0.9)
NEUTROS ABS: 2.6 10*3/uL (ref 1.4–7.0)
Neutrophils: 37 %
Platelets: 274 10*3/uL (ref 150–450)
RBC: 4.32 x10E6/uL (ref 3.77–5.28)
RDW: 12.8 % (ref 12.3–15.4)
RETIC CT PCT: 1.1 % (ref 0.6–2.6)
Total Iron Binding Capacity: 255 ug/dL (ref 250–450)
UIBC: 192 ug/dL (ref 131–425)
VITAMIN B 12: 321 pg/mL (ref 232–1245)
WBC: 7.2 10*3/uL (ref 3.4–10.8)

## 2018-02-21 LAB — LIPID PANEL
CHOL/HDL RATIO: 1.9 ratio (ref 0.0–4.4)
Cholesterol, Total: 149 mg/dL (ref 100–199)
HDL: 79 mg/dL (ref >39)
LDL Calculated: 56 mg/dL (ref 0–99)
Triglycerides: 70 mg/dL (ref 0–149)
VLDL Cholesterol Cal: 14 mg/dL (ref 5–40)

## 2018-02-21 LAB — THYROID PANEL WITH TSH
FREE THYROXINE INDEX: 1.4 (ref 1.2–4.9)
T3 UPTAKE RATIO: 24 % (ref 24–39)
T4, Total: 5.8 ug/dL (ref 4.5–12.0)
TSH: 2.63 u[IU]/mL (ref 0.450–4.500)

## 2018-02-21 LAB — MEASLES/MUMPS/RUBELLA IMMUNITY
MUMPS ABS, IGG: 233 AU/mL (ref >10.9)
RUBEOLA AB, IGG: >300 AU/mL (ref >29.9)
Rubella Antibodies, IGG: 2.78 index (ref >0.99)

## 2018-02-21 LAB — VITAMIN D 25 HYDROXY (VIT D DEFICIENCY, FRACTURES): Vit D, 25-Hydroxy: 29.7 ng/mL — ABNORMAL LOW (ref 30.0–100.0)

## 2018-02-22 ENCOUNTER — Ambulatory Visit (INDEPENDENT_AMBULATORY_CARE_PROVIDER_SITE_OTHER): Payer: Managed Care, Other (non HMO) | Admitting: Family

## 2018-02-22 ENCOUNTER — Encounter: Payer: Self-pay | Admitting: Family

## 2018-02-22 VITALS — BP 115/80 | HR 64 | Temp 98.0°F | Ht 63.0 in | Wt 155.0 lb

## 2018-02-22 DIAGNOSIS — Z01419 Encounter for gynecological examination (general) (routine) without abnormal findings: Secondary | ICD-10-CM | POA: Diagnosis not present

## 2018-02-22 DIAGNOSIS — Z0000 Encounter for general adult medical examination without abnormal findings: Secondary | ICD-10-CM | POA: Diagnosis not present

## 2018-02-22 DIAGNOSIS — E663 Overweight: Secondary | ICD-10-CM

## 2018-02-22 DIAGNOSIS — K219 Gastro-esophageal reflux disease without esophagitis: Secondary | ICD-10-CM

## 2018-02-22 DIAGNOSIS — F17200 Nicotine dependence, unspecified, uncomplicated: Secondary | ICD-10-CM | POA: Insufficient documentation

## 2018-02-22 NOTE — Patient Instructions (Signed)

## 2018-02-22 NOTE — Progress Notes (Signed)
Subjective:    Patient ID: Jennifer Parsons, female    DOB: Oct 27, 1975, 42 y.o.   MRN: [id number]  Chief Complaint  Patient presents with  . Gynecologic Exam   PT presents to the office today for CPE with pap.  Gastroesophageal Reflux  She complains of heartburn. She reports no belching or no coughing. This is a chronic problem. The current episode started more than 1 year ago. The problem occurs rarely. The problem has been waxing and waning. She has tried an antacid for the symptoms. The treatment provided moderate relief.  Headache   This is a chronic problem. The current episode started more than 1 year ago. The problem occurs intermittently (twice a week). The problem has been waxing and waning. The pain is located in the frontal region. The pain quality is similar to prior headaches. The pain is mild. Associated symptoms include sinus pressure. Pertinent negatives include no coughing, drainage, eye redness, phonophobia or photophobia.  Gynecologic Exam  The patient's pertinent negatives include no genital itching, genital lesions, genital odor or vaginal discharge. The patient is experiencing no pain. Associated symptoms include headaches. She is sexually active.      Review of Systems  HENT: Positive for sinus pressure.   Eyes: Negative for photophobia and redness.  Respiratory: Negative for cough.   Gastrointestinal: Positive for heartburn.  Genitourinary: Negative for vaginal discharge.  Neurological: Positive for headaches.  All other systems reviewed and are negative.      Objective:   Physical Exam  Constitutional: She is oriented to person, place, and time. She appears well-developed and well-nourished. No distress.  HENT:  Head: Normocephalic and atraumatic.  Right Ear: External ear normal.  Mouth/Throat: Oropharynx is clear and moist.  Eyes: Pupils are equal, round, and reactive to light.  Neck: Normal range of motion. Neck supple. No thyromegaly present.    Cardiovascular: Normal rate, regular rhythm, normal heart sounds and intact distal pulses.  No murmur heard. Pulmonary/Chest: Effort normal and breath sounds normal. No respiratory distress. She has no wheezes. Right breast exhibits no inverted nipple, no mass, no nipple discharge, no skin change and no tenderness. Left breast exhibits no inverted nipple, no mass, no nipple discharge, no skin change and no tenderness.  Abdominal: Soft. Bowel sounds are normal. She exhibits no distension. There is no tenderness.  Genitourinary: Vagina normal.  Genitourinary Comments: Bimanual exam- no adnexal masses or tenderness, ovaries nonpalpable   Cervix parous and pink- No discharge   Musculoskeletal: Normal range of motion. She exhibits no edema or tenderness.  Neurological: She is alert and oriented to person, place, and time. She has normal reflexes. No cranial nerve deficit.  Skin: Skin is warm and dry.  Psychiatric: She has a normal mood and affect. Her behavior is normal. Judgment and thought content normal.  Vitals reviewed.     BP 115/80   Pulse 64   Temp 98 F (36.7 C) (Oral)   Ht 5\' 3"  (1.6 m)   Wt 155 lb (70.3 kg)   LMP 02/08/2018   BMI 27.46 kg/m      Assessment & Plan:  Jennifer Parsons comes in today with chief complaint of Gynecologic Exam   Diagnosis and orders addressed:  1. Annual physical exam  2. Gastroesophageal reflux disease, esophagitis presence not specified  3. Overweight (BMI 25.0-29.9)  4. Encounter for gynecological examination without abnormal finding - IGP, Aptima HPV, rfx 16/18,45  5. Current smoker Smoking cessation discussed   Labs discussed and  reviewed Health Maintenance reviewed Diet and exercise encouraged  Follow up plan: 1 year   Evelina Dun, FNP

## 2018-02-27 LAB — IGP, APTIMA HPV, RFX 16/18,45
HPV APTIMA: NEGATIVE
PAP SMEAR COMMENT: 0

## 2018-03-15 ENCOUNTER — Encounter: Payer: Self-pay | Admitting: Physician Assistant

## 2018-03-15 ENCOUNTER — Ambulatory Visit: Payer: Managed Care, Other (non HMO) | Admitting: Physician Assistant

## 2018-03-15 VITALS — BP 114/76 | HR 72 | Temp 97.2°F | Ht 63.0 in | Wt 155.0 lb

## 2018-03-15 DIAGNOSIS — W57XXXA Bitten or stung by nonvenomous insect and other nonvenomous arthropods, initial encounter: Secondary | ICD-10-CM

## 2018-03-15 DIAGNOSIS — S70362A Insect bite (nonvenomous), left thigh, initial encounter: Secondary | ICD-10-CM

## 2018-03-15 MED ORDER — DOXYCYCLINE HYCLATE 100 MG PO TABS: 100.0000 mg | ORAL_TABLET | Freq: Two times a day (BID) | ORAL | 0 refills | Status: DC

## 2018-03-17 NOTE — Progress Notes (Signed)
BP 114/76   Pulse 72   Temp (!) 97.2 F (36.2 C) (Oral)   Ht 5\' 3"  (1.6 m)   Wt 155 lb (70.3 kg)   BMI 27.46 kg/m    Subjective:    Patient ID: Jennifer Parsons, female    DOB: [birth year], 42 y.o.   MRN: [id number]  HPI: Jennifer Parsons is a 42 y.o. female presenting on 03/15/2018 for Tick Removal  This patient comes in having known exposure to ticks.  Her husband got a lot of calcium and they were doing some yard work.  She thought that she was clear of any however this morning she woke up and down one in her groin.  She has already had some swelling around the bite.  She denies any fever chills or headaches.   Past Medical History:  Diagnosis Date  . Fibrocystic breast   . GERD (gastroesophageal reflux disease)    Relevant past medical, surgical, family and social history reviewed and updated as indicated. Interim medical history since our last visit reviewed. Allergies and medications reviewed and updated. DATA REVIEWED: CHART IN EPIC  Family History reviewed for pertinent findings.  Review of Systems  Constitutional: Negative.   HENT: Negative.   Eyes: Negative.   Respiratory: Negative.   Gastrointestinal: Negative.   Genitourinary: Negative.   Skin: Positive for color change and wound.    Allergies as of 03/15/2018      Reactions   Bee Venom Anaphylaxis   Fish Allergy Anaphylaxis   Nutritional Supplements       Medication List        Accurate as of 03/15/18 11:59 PM. Always use your most recent med list.          carbamazepine 200 MG tablet Commonly known as:  TEGRETOL as needed.   doxycycline 100 MG tablet Commonly known as:  VIBRA-TABS Take 1 tablet (100 mg total) by mouth 2 (two) times daily. 1 po bid   EPINEPHrine 0.3 mg/0.3 mL Soaj injection Commonly known as:  EPI-PEN   fexofenadine-pseudoephedrine 180-240 MG 24 hr tablet Commonly known as:  ALLEGRA-D 24 Take 1 tablet by mouth at bedtime.   phentermine 37.5 MG tablet Commonly known as:   ADIPEX-P Take 1 tablet (37.5 mg total) by mouth every morning.   topiramate 25 MG tablet Commonly known as:  TOPAMAX Take 1 tablet (25 mg total) by mouth at bedtime.          Objective:    BP 114/76   Pulse 72   Temp (!) 97.2 F (36.2 C) (Oral)   Ht 5\' 3"  (1.6 m)   Wt 155 lb (70.3 kg)   BMI 27.46 kg/m   Allergies  Allergen Reactions  . Bee Venom Anaphylaxis  . Fish Allergy Anaphylaxis  . Nutritional Supplements     Wt Readings from Last 3 Encounters:  03/15/18 155 lb (70.3 kg)  02/22/18 155 lb (70.3 kg)  11/16/17 154 lb (69.9 kg)    Physical Exam  Constitutional: She is oriented to person, place, and time. She appears well-developed and well-nourished.  HENT:  Head: Normocephalic and atraumatic.  Eyes: Pupils are equal, round, and reactive to light. Conjunctivae and EOM are normal.  Cardiovascular: Normal rate, regular rhythm, normal heart sounds and intact distal pulses.  Pulmonary/Chest: Effort normal and breath sounds normal.  Abdominal: Soft. Bowel sounds are normal.  Neurological: She is alert and oriented to person, place, and time. She has normal reflexes.  Skin: Skin is  warm and dry. Rash noted. Rash is maculopapular. There is erythema.  Erythematous lesion clear of tick, in the left groin. Swelling and warmth  Psychiatric: She has a normal mood and affect. Her behavior is normal. Judgment and thought content normal.        Assessment & Plan:   1. Tick bite, initial encounter - doxycycline (VIBRA-TABS) 100 MG tablet; Take 1 tablet (100 mg total) by mouth 2 (two) times daily. 1 po bid  Dispense: 20 tablet; Refill: 0   Continue all other maintenance medications as listed above.  Follow up plan: No follow-ups on file.  Educational handout given for [place] [facility] [address]  [place], [place] [address] [phone]   03/17/2018, 4:45 PM

## 2018-04-01 ENCOUNTER — Other Ambulatory Visit: Payer: Self-pay | Admitting: Nurse Practitioner

## 2018-05-14 ENCOUNTER — Other Ambulatory Visit: Payer: Self-pay | Admitting: Family

## 2018-05-14 ENCOUNTER — Other Ambulatory Visit: Payer: Self-pay | Admitting: Family Medicine

## 2018-05-14 MED ORDER — FLUCONAZOLE 150 MG PO TABS: 150.0000 mg | ORAL_TABLET | ORAL | 0 refills | Status: DC | PRN

## 2018-05-14 MED ORDER — METRONIDAZOLE 500 MG PO TABS: 500.0000 mg | ORAL_TABLET | Freq: Two times a day (BID) | ORAL | 0 refills | Status: DC

## 2018-05-15 NOTE — Telephone Encounter (Signed)
Last seen 03/15/18

## 2018-05-30 ENCOUNTER — Ambulatory Visit (INDEPENDENT_AMBULATORY_CARE_PROVIDER_SITE_OTHER): Payer: Managed Care, Other (non HMO)

## 2018-05-30 DIAGNOSIS — Z23 Encounter for immunization: Secondary | ICD-10-CM | POA: Diagnosis not present

## 2018-08-29 ENCOUNTER — Encounter: Payer: Self-pay | Admitting: Family Medicine

## 2018-08-29 ENCOUNTER — Ambulatory Visit: Payer: Managed Care, Other (non HMO) | Admitting: Family Medicine

## 2018-08-29 VITALS — BP 109/66 | HR 69 | Temp 97.5°F | Ht 63.0 in | Wt 166.0 lb

## 2018-08-29 DIAGNOSIS — R5383 Other fatigue: Secondary | ICD-10-CM | POA: Diagnosis not present

## 2018-08-29 DIAGNOSIS — R635 Abnormal weight gain: Secondary | ICD-10-CM

## 2018-08-29 MED ORDER — TOPIRAMATE 25 MG PO TABS: 25.0000 mg | ORAL_TABLET | Freq: Every day | ORAL | 5 refills | Status: DC

## 2018-08-29 MED ORDER — PHENTERMINE HCL 37.5 MG PO TABS: 37.5000 mg | ORAL_TABLET | Freq: Every morning | ORAL | 5 refills | Status: DC

## 2018-08-29 NOTE — Progress Notes (Signed)
Subjective:  Patient ID: Earlene Plater, female    DOB: 03/09/1976  Age: 43 y.o. MRN: [id number]  CC: Weight Gain   HPI SYLENA LOTTER presents for concerns for medical cause of weight gain. Has put on 11 pounds in spite of monitoring diet closely and getting above average amounts of regular exercise. Energy is fair to good. No edema. She is a smoker. No cough.   Depression screen [facility] 2/9 02/22/2018 06/04/2017 11/08/2016  Decreased Interest 0 0 0  Down, Depressed, Hopeless 0 0 0  PHQ - 2 Score 0 0 0    History Sydne has a past medical history of Fibrocystic breast and GERD (gastroesophageal reflux disease).   She has a past surgical history that includes Tubal ligation; Wisdom tooth extraction; and Esophageal dilation.   Her family history includes Breast cancer in her maternal aunt and paternal aunt; COPD in her father; Diabetes in her paternal grandmother; Esophageal cancer in her paternal grandfather; Heart disease in her maternal grandfather and maternal grandmother; Hypertension in her brother and sister; Ovarian cancer in her paternal aunt; Stomach cancer in her maternal grandfather; Uterine cancer in her mother.She reports that she has been smoking. She has been smoking about 0.25 packs per day. She has never used smokeless tobacco. She reports current alcohol use. She reports that she does not use drugs.    ROS Review of Systems  Constitutional: Negative for activity change, appetite change and fatigue.  HENT: Negative.   Respiratory: Negative.   Cardiovascular: Negative.     Objective:  BP 109/66   Pulse 69   Temp (!) 97.5 F (36.4 C) (Oral)   Ht _0  (1.6 m)   Wt 166 lb (75.3 kg)   BMI 29.41 kg/m   BP Readings from Last 3 Encounters:  08/29/18 109/66  03/15/18 114/76  02/22/18 115/80    Wt Readings from Last 3 Encounters:  08/29/18 166 lb (75.3 kg)  03/15/18 155 lb (70.3 kg)  02/22/18 155 lb (70.3 kg)     Physical Exam Constitutional:      General:  She is not in acute distress.    Appearance: She is well-developed.  Cardiovascular:     Rate and Rhythm: Normal rate and regular rhythm.  Pulmonary:     Breath sounds: Normal breath sounds.  Skin:    General: Skin is warm and dry.  Neurological:     Mental Status: She is alert and oriented to person, place, and time.       Assessment & Plan:   Magie was seen today for weight gain.  Diagnoses and all orders for this visit:  Weight gain -     CBC with Differential/Platelet; Future -     CMP14+EGFR; Future -     VITAMIN D 25 Hydroxy (Vit-D Deficiency, Fractures); Future -     Cortisol; Future  Fatigue, unspecified type -     TSH; Future -     Vitamin B12; Future -     Folate; Future       I have discontinued Aristea Posada. Muckey's doxycycline, fluconazole, and metroNIDAZOLE. I am also having her maintain her fexofenadine-pseudoephedrine, EPINEPHrine, carbamazepine, phentermine, and topiramate.  Allergies as of 08/29/2018      Reactions   Bee Venom Anaphylaxis   Fish Allergy Anaphylaxis   Nutritional Supplements    Calcium gives her hives      Medication List       Accurate as of August 29, 2018  2:15 PM.  Always use your most recent med list.        carbamazepine 200 MG tablet Commonly known as:  TEGRETOL as needed.   EPINEPHrine 0.3 mg/0.3 mL Soaj injection Commonly known as:  EPI-PEN   fexofenadine-pseudoephedrine 180-240 MG 24 hr tablet Commonly known as:  ALLEGRA-D ALLERGY & CONGESTION Take 1 tablet by mouth at bedtime.   phentermine 37.5 MG tablet Commonly known as:  ADIPEX-P TAKE ONE TABLET EVERY MORNING   topiramate 25 MG tablet Commonly known as:  TOPAMAX TAKE ONE TABLET DAILY AT BEDTIME        Follow-up: No follow-ups on file.  Claretta Fraise, M.D.

## 2018-08-30 NOTE — Addendum Note (Signed)
Addended by: Wyline Mood on: 08/30/2018 08:38 AM   Modules accepted: Orders

## 2018-08-31 LAB — CMP14+EGFR
ALBUMIN: 4 g/dL (ref 3.8–4.8)
ALT: 16 IU/L (ref 0–32)
AST: 18 IU/L (ref 0–40)
Albumin/Globulin Ratio: 1.6 (ref 1.2–2.2)
Alkaline Phosphatase: 81 IU/L (ref 39–117)
BUN/Creatinine Ratio: 16 (ref 9–23)
BUN: 14 mg/dL (ref 6–24)
Bilirubin Total: 0.3 mg/dL (ref 0.0–1.2)
CO2: 22 mmol/L (ref 20–29)
Calcium: 8.9 mg/dL (ref 8.7–10.2)
Chloride: 102 mmol/L (ref 96–106)
Creatinine, Ser: 0.85 mg/dL (ref 0.57–1.00)
GFR calc Af Amer: 98 mL/min/{1.73_m2} (ref >59)
GFR calc non Af Amer: 85 mL/min/{1.73_m2} (ref >59)
GLUCOSE: 76 mg/dL (ref 65–99)
Globulin, Total: 2.5 g/dL (ref 1.5–4.5)
Potassium: 4.3 mmol/L (ref 3.5–5.2)
Sodium: 139 mmol/L (ref 134–144)
Total Protein: 6.5 g/dL (ref 6.0–8.5)

## 2018-08-31 LAB — CBC WITH DIFFERENTIAL/PLATELET
Basophils Absolute: 0.1 10*3/uL (ref 0.0–0.2)
Basos: 1 %
EOS (ABSOLUTE): 0.4 10*3/uL (ref 0.0–0.4)
Eos: 5 %
HEMOGLOBIN: 13.8 g/dL (ref 11.1–15.9)
Hematocrit: 41 % (ref 34.0–46.6)
Immature Grans (Abs): 0 10*3/uL (ref 0.0–0.1)
Immature Granulocytes: 0 %
LYMPHS ABS: 3 10*3/uL (ref 0.7–3.1)
Lymphs: 39 %
MCH: 31.4 pg (ref 26.6–33.0)
MCHC: 33.7 g/dL (ref 31.5–35.7)
MCV: 93 fL (ref 79–97)
Monocytes Absolute: 0.8 10*3/uL (ref 0.1–0.9)
Monocytes: 10 %
Neutrophils Absolute: 3.4 10*3/uL (ref 1.4–7.0)
Neutrophils: 45 %
Platelets: 300 10*3/uL (ref 150–450)
RBC: 4.4 x10E6/uL (ref 3.77–5.28)
RDW: 11.8 % (ref 11.7–15.4)
WBC: 7.7 10*3/uL (ref 3.4–10.8)

## 2018-08-31 LAB — VITAMIN D 25 HYDROXY (VIT D DEFICIENCY, FRACTURES): Vit D, 25-Hydroxy: 20.9 ng/mL — ABNORMAL LOW (ref 30.0–100.0)

## 2018-08-31 LAB — TSH: TSH: 3.1 u[IU]/mL (ref 0.450–4.500)

## 2018-08-31 LAB — VITAMIN B12: Vitamin B-12: 332 pg/mL (ref 232–1245)

## 2018-08-31 LAB — CORTISOL: Cortisol: 17.5 ug/dL

## 2018-08-31 LAB — FOLATE: FOLATE: 6.2 ng/mL (ref >3.0)

## 2018-09-02 ENCOUNTER — Encounter: Payer: Self-pay | Admitting: *Deleted

## 2018-09-02 ENCOUNTER — Other Ambulatory Visit: Payer: Self-pay | Admitting: *Deleted

## 2018-09-02 MED ORDER — VITAMIN D (ERGOCALCIFEROL) 1.25 MG (50000 UNIT) PO CAPS: 50000.0000 [IU] | ORAL_CAPSULE | ORAL | 1 refills | Status: DC

## 2018-10-14 ENCOUNTER — Other Ambulatory Visit: Payer: Self-pay | Admitting: Family

## 2018-10-14 MED ORDER — FLUCONAZOLE 150 MG PO TABS: 150.0000 mg | ORAL_TABLET | ORAL | 0 refills | Status: DC | PRN

## 2019-01-25 ENCOUNTER — Other Ambulatory Visit: Payer: Self-pay | Admitting: Family Medicine

## 2019-02-11 ENCOUNTER — Other Ambulatory Visit: Payer: Self-pay

## 2019-02-11 ENCOUNTER — Other Ambulatory Visit: Payer: Managed Care, Other (non HMO)

## 2019-02-11 DIAGNOSIS — Z0000 Encounter for general adult medical examination without abnormal findings: Secondary | ICD-10-CM

## 2019-02-11 DIAGNOSIS — R5383 Other fatigue: Secondary | ICD-10-CM

## 2019-02-13 LAB — CBC WITH DIFFERENTIAL/PLATELET
Basophils Absolute: 0.1 10*3/uL (ref 0.0–0.2)
Basos: 1 %
EOS (ABSOLUTE): 0.3 10*3/uL (ref 0.0–0.4)
Eos: 4 %
Hematocrit: 40.2 % (ref 34.0–46.6)
Hemoglobin: 13.7 g/dL (ref 11.1–15.9)
Immature Grans (Abs): 0 10*3/uL (ref 0.0–0.1)
Immature Granulocytes: 0 %
Lymphocytes Absolute: 3.1 10*3/uL (ref 0.7–3.1)
Lymphs: 42 %
MCH: 32.1 pg (ref 26.6–33.0)
MCHC: 34.1 g/dL (ref 31.5–35.7)
MCV: 94 fL (ref 79–97)
Monocytes Absolute: 0.6 10*3/uL (ref 0.1–0.9)
Monocytes: 8 %
Neutrophils Absolute: 3.3 10*3/uL (ref 1.4–7.0)
Neutrophils: 45 %
Platelets: 286 10*3/uL (ref 150–450)
RBC: 4.27 x10E6/uL (ref 3.77–5.28)
RDW: 12.4 % (ref 11.7–15.4)
WBC: 7.3 10*3/uL (ref 3.4–10.8)

## 2019-02-13 LAB — CMP14+EGFR
ALT: 18 IU/L (ref 0–32)
AST: 23 IU/L (ref 0–40)
Albumin/Globulin Ratio: 1.4 (ref 1.2–2.2)
Albumin: 3.5 g/dL — ABNORMAL LOW (ref 3.8–4.8)
Alkaline Phosphatase: 78 IU/L (ref 39–117)
BUN/Creatinine Ratio: 13 (ref 9–23)
BUN: 11 mg/dL (ref 6–24)
Bilirubin Total: 0.3 mg/dL (ref 0.0–1.2)
CO2: 23 mmol/L (ref 20–29)
Calcium: 8.5 mg/dL — ABNORMAL LOW (ref 8.7–10.2)
Chloride: 103 mmol/L (ref 96–106)
Creatinine, Ser: 0.85 mg/dL (ref 0.57–1.00)
GFR calc Af Amer: 98 mL/min/{1.73_m2} (ref >59)
GFR calc non Af Amer: 85 mL/min/{1.73_m2} (ref >59)
Globulin, Total: 2.5 g/dL (ref 1.5–4.5)
Glucose: 72 mg/dL (ref 65–99)
Potassium: 4 mmol/L (ref 3.5–5.2)
Sodium: 140 mmol/L (ref 134–144)
Total Protein: 6 g/dL (ref 6.0–8.5)

## 2019-02-13 LAB — LIPID PANEL
Chol/HDL Ratio: 1.9 ratio (ref 0.0–4.4)
Cholesterol, Total: 137 mg/dL (ref 100–199)
HDL: 73 mg/dL (ref >39)
LDL Calculated: 39 mg/dL (ref 0–99)
Triglycerides: 123 mg/dL (ref 0–149)
VLDL Cholesterol Cal: 25 mg/dL (ref 5–40)

## 2019-02-13 LAB — STD SCREEN (8)
HIV Screen 4th Generation wRfx: NONREACTIVE
HSV 1 Glycoprotein G Ab, IgG: 2.66 index — ABNORMAL HIGH (ref 0.00–0.90)
HSV 2 IgG, Type Spec: <0.91 index (ref 0.00–0.90)
Hep A IgM: NEGATIVE
Hep B C IgM: NEGATIVE
Hep C Virus Ab: <0.1 s/co ratio (ref 0.0–0.9)
Hepatitis B Surface Ag: NEGATIVE
RPR Ser Ql: NONREACTIVE

## 2019-02-13 LAB — ROCKY MTN SPOTTED FVR ABS PNL(IGG+IGM)
RMSF IgG: NEGATIVE
RMSF IgM: 0.21 index (ref 0.00–0.89)

## 2019-02-13 LAB — LYME AB/WESTERN BLOT REFLEX
LYME DISEASE AB, QUANT, IGM: <0.8 index (ref 0.00–0.79)
Lyme IgG/IgM Ab: <0.91 {ISR} (ref 0.00–0.90)

## 2019-03-07 ENCOUNTER — Ambulatory Visit (INDEPENDENT_AMBULATORY_CARE_PROVIDER_SITE_OTHER): Payer: Managed Care, Other (non HMO) | Admitting: Family

## 2019-03-07 ENCOUNTER — Other Ambulatory Visit: Payer: Self-pay

## 2019-03-07 ENCOUNTER — Encounter: Payer: Self-pay | Admitting: Family

## 2019-03-07 VITALS — BP 120/76 | HR 60 | Temp 98.7°F | Ht 63.0 in | Wt 166.0 lb

## 2019-03-07 DIAGNOSIS — Z0000 Encounter for general adult medical examination without abnormal findings: Secondary | ICD-10-CM

## 2019-03-07 DIAGNOSIS — Z0001 Encounter for general adult medical examination with abnormal findings: Secondary | ICD-10-CM | POA: Diagnosis not present

## 2019-03-07 DIAGNOSIS — F17200 Nicotine dependence, unspecified, uncomplicated: Secondary | ICD-10-CM | POA: Diagnosis not present

## 2019-03-07 DIAGNOSIS — K219 Gastro-esophageal reflux disease without esophagitis: Secondary | ICD-10-CM | POA: Diagnosis not present

## 2019-03-07 DIAGNOSIS — Z713 Dietary counseling and surveillance: Secondary | ICD-10-CM

## 2019-03-07 DIAGNOSIS — G500 Trigeminal neuralgia: Secondary | ICD-10-CM | POA: Insufficient documentation

## 2019-03-07 DIAGNOSIS — E663 Overweight: Secondary | ICD-10-CM | POA: Diagnosis not present

## 2019-03-07 DIAGNOSIS — E559 Vitamin D deficiency, unspecified: Secondary | ICD-10-CM

## 2019-03-07 LAB — BAYER DCA HB A1C WAIVED: HB A1C (BAYER DCA - WAIVED): 5.2 % (ref <7.0)

## 2019-03-07 MED ORDER — VITAMIN D (ERGOCALCIFEROL) 1.25 MG (50000 UNIT) PO CAPS: 50000.0000 [IU] | ORAL_CAPSULE | ORAL | 4 refills | Status: DC

## 2019-03-07 MED ORDER — PHENTERMINE HCL 37.5 MG PO TABS: 37.5000 mg | ORAL_TABLET | Freq: Every morning | ORAL | 2 refills | Status: DC

## 2019-03-07 NOTE — Patient Instructions (Signed)
Health Maintenance, Female Adopting a healthy lifestyle and getting preventive care are important in promoting health and wellness. Ask your health care provider about:  The right schedule for you to have regular tests and exams.  Things you can do on your own to prevent diseases and keep yourself healthy. What should I know about diet, weight, and exercise? Eat a healthy diet   Eat a diet that includes plenty of vegetables, fruits, low-fat dairy products, and lean protein.  Do not eat a lot of foods that are high in solid fats, added sugars, or sodium. Maintain a healthy weight Body mass index (BMI) is used to identify weight problems. It estimates body fat based on height and weight. Your health care provider can help determine your BMI and help you achieve or maintain a healthy weight. Get regular exercise Get regular exercise. This is one of the most important things you can do for your health. Most adults should:  Exercise for at least 150 minutes each week. The exercise should increase your heart rate and make you sweat (moderate-intensity exercise).  Do strengthening exercises at least twice a week. This is in addition to the moderate-intensity exercise.  Spend less time sitting. Even light physical activity can be beneficial. Watch cholesterol and blood lipids Have your blood tested for lipids and cholesterol at 42 years of age, then have this test every 5 years. Have your cholesterol levels checked more often if:  Your lipid or cholesterol levels are high.  You are older than 42 years of age.  You are at high risk for heart disease. What should I know about cancer screening? Depending on your health history and family history, you may need to have cancer screening at various ages. This may include screening for:  Breast cancer.  Cervical cancer.  Colorectal cancer.  Skin cancer.  Lung cancer. What should I know about heart disease, diabetes, and high blood  pressure? Blood pressure and heart disease  High blood pressure causes heart disease and increases the risk of stroke. This is more likely to develop in people who have high blood pressure readings, are of African descent, or are overweight.  Have your blood pressure checked: ? Every 3-5 years if you are 18-39 years of age. ? Every year if you are 40 years old or older. Diabetes Have regular diabetes screenings. This checks your fasting blood sugar level. Have the screening done:  Once every three years after age 40 if you are at a normal weight and have a low risk for diabetes.  More often and at a younger age if you are overweight or have a high risk for diabetes. What should I know about preventing infection? Hepatitis B If you have a higher risk for hepatitis B, you should be screened for this virus. Talk with your health care provider to find out if you are at risk for hepatitis B infection. Hepatitis C Testing is recommended for:  Everyone born from 1945 through 1965.  Anyone with known risk factors for hepatitis C. Sexually transmitted infections (STIs)  Get screened for STIs, including gonorrhea and chlamydia, if: ? You are sexually active and are younger than 42 years of age. ? You are older than 42 years of age and your health care provider tells you that you are at risk for this type of infection. ? Your sexual activity has changed since you were last screened, and you are at increased risk for chlamydia or gonorrhea. Ask your health care provider if   you are at risk.  Ask your health care provider about whether you are at high risk for HIV. Your health care provider may recommend a prescription medicine to help prevent HIV infection. If you choose to take medicine to prevent HIV, you should first get tested for HIV. You should then be tested every 3 months for as long as you are taking the medicine. Pregnancy  If you are about to stop having your period (premenopausal) and  you may become pregnant, seek counseling before you get pregnant.  Take 400 to 800 micrograms (mcg) of folic acid every day if you become pregnant.  Ask for birth control (contraception) if you want to prevent pregnancy. Osteoporosis and menopause Osteoporosis is a disease in which the bones lose minerals and strength with aging. This can result in bone fractures. If you are 65 years old or older, or if you are at risk for osteoporosis and fractures, ask your health care provider if you should:  Be screened for bone loss.  Take a calcium or vitamin D supplement to lower your risk of fractures.  Be given hormone replacement therapy (HRT) to treat symptoms of menopause. Follow these instructions at home: Lifestyle  Do not use any products that contain nicotine or tobacco, such as cigarettes, e-cigarettes, and chewing tobacco. If you need help quitting, ask your health care provider.  Do not use street drugs.  Do not share needles.  Ask your health care provider for help if you need support or information about quitting drugs. Alcohol use  Do not drink alcohol if: ? Your health care provider tells you not to drink. ? You are pregnant, may be pregnant, or are planning to become pregnant.  If you drink alcohol: ? Limit how much you use to 0-1 drink a day. ? Limit intake if you are breastfeeding.  Be aware of how much alcohol is in your drink. In the [place] one drink equals one 12 oz bottle of beer (355 mL), one 5 oz glass of wine (148 mL), or one 1 oz glass of hard liquor (44 mL). General instructions  Schedule regular health, dental, and eye exams.  Stay current with your vaccines.  Tell your health care provider if: ? You often feel depressed. ? You have ever been abused or do not feel safe at home. Summary  Adopting a healthy lifestyle and getting preventive care are important in promoting health and wellness.  Follow your health care provider's instructions about healthy  diet, exercising, and getting tested or screened for diseases.  Follow your health care provider's instructions on monitoring your cholesterol and blood pressure. This information is not intended to replace advice given to you by your health care provider. Make sure you discuss any questions you have with your health care provider. Document Released: 01/09/2011 Document Revised: 06/19/2018 Document Reviewed: 06/19/2018 Elsevier Patient Education  2020 [employer]  

## 2019-03-07 NOTE — Progress Notes (Signed)
Subjective:    Patient ID: Jennifer Parsons, female    DOB: 03-30-1976, 43 y.o.   MRN: [id number]  Chief Complaint  Patient presents with  . Annual Exam   Pt presents to the office today for CPE without pap. Pt had her lab work drawn on 02/11/19. She is requesting a refill of phentermine. She states she would like a refill on her phentermine. She states this has helped with weight loss in the past. She states it has been a few months without the phentermine and has gained 5-10 lbs.  Gastroesophageal Reflux She reports no belching or no heartburn. This is a chronic problem. The current episode started more than 1 year ago. The problem occurs occasionally. The problem has been waxing and waning. The symptoms are aggravated by certain foods. She has tried a PPI for the symptoms. The treatment provided moderate relief.  Nicotine Dependence Presents for follow-up visit. Her urge triggers include company of smokers. The symptoms have been stable. She smokes < 1/2 a pack of cigarettes per day.  Trigeminal Neuralgia  Pt takes tegretol as needed and works well. Uses mouth guard at night.     Review of Systems  Gastrointestinal: Negative for heartburn.  All other systems reviewed and are negative.  Family History  Problem Relation Age of Onset  . Breast cancer Paternal Aunt   . Esophageal cancer Paternal Grandfather   . Uterine cancer Mother   . Heart disease Maternal Grandmother   . Breast cancer Maternal Aunt   . COPD Father   . Ovarian cancer Paternal Aunt   . Stomach cancer Maternal Grandfather   . Heart disease Maternal Grandfather   . Diabetes Paternal Grandmother   . Hypertension Sister   . Hypertension Brother     Social History   Socioeconomic History  . Marital status: Married    Spouse name: Not on file  . Number of children: 1  . Years of education: Not on file  . Highest education level: Not on file  Occupational History  . 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  Social Needs  . Financial resource strain: Not on file  . Food insecurity    Worry: Not on file    Inability: Not on file  . Transportation needs    Medical: Not on file    Non-medical: Not on file  Tobacco Use  . Smoking status: Current Every Day Smoker    Packs/day: 0.25  . Smokeless tobacco: Never Used  Substance and Sexual Activity  . Alcohol use: Yes    Comment: Occ.  . Drug use: No  . Sexual activity: Yes  Lifestyle  . Physical activity    Days per week: Not on file    Minutes per session: Not on file  . Stress: Not on file  Relationships  . Social Herbalist on phone: Not on file    Gets together: Not on file    Attends religious service: Not on file    Active member of club or organization: Not on file    Attends meetings of clubs or organizations: Not on file    Relationship status: Not on file  . Intimate partner violence    Fear of current or ex partner: Not on file    Emotionally abused: Not on file    Physically abused: Not on file    Forced sexual activity: Not on file  Other Topics Concern  . Not  on file  Social History Narrative  . Not on file       Objective:   Physical Exam Vitals signs reviewed.  Constitutional:      General: She is not in acute distress.    Appearance: She is well-developed.  HENT:     Head: Normocephalic and atraumatic.     Right Ear: Tympanic membrane normal.     Left Ear: Tympanic membrane normal.  Eyes:     Pupils: Pupils are equal, round, and reactive to light.  Neck:     Musculoskeletal: Normal range of motion and neck supple.     Thyroid: No thyromegaly.  Cardiovascular:     Rate and Rhythm: Normal rate and regular rhythm.     Heart sounds: Normal heart sounds. No murmur.  Pulmonary:     Effort: Pulmonary effort is normal. No respiratory distress.     Breath sounds: Normal breath sounds. No wheezing.  Abdominal:     General: Bowel sounds are normal. There is no  distension.     Palpations: Abdomen is soft.     Tenderness: There is no abdominal tenderness.  Musculoskeletal: Normal range of motion.        General: No tenderness.  Skin:    General: Skin is warm and dry.  Neurological:     Mental Status: She is alert and oriented to person, place, and time.     Cranial Nerves: No cranial nerve deficit.     Deep Tendon Reflexes: Reflexes are normal and symmetric.  Psychiatric:        Behavior: Behavior normal.        Thought Content: Thought content normal.        Judgment: Judgment normal.     BP 120/76   Pulse 60   Temp 98.7 F (37.1 C) (Temporal)   Ht 5\' 3"  (1.6 m)   Wt 166 lb (75.3 kg)   BMI 29.41 kg/m      Assessment & Plan:  ARIUS PENISTON comes in today with chief complaint of Annual Exam   Diagnosis and orders addressed:  1. Annual physical exam - VITAMIN D 25 Hydroxy (Vit-D Deficiency, Fractures) - Bayer DCA Hb A1c Waived  2. Current smoker  3. Overweight (BMI 25.0-29.9) - phentermine (ADIPEX-P) 37.5 MG tablet; Take 1 tablet (37.5 mg total) by mouth every morning.  Dispense: 30 tablet; Refill: 2  4. Gastroesophageal reflux disease, esophagitis presence not specified  5. Trigeminal neuralgia  6. Weight loss counseling, encounter for Will reorder phentermine Must lose 5% to stay on medication Encouraged healthy diet and exercise - phentermine (ADIPEX-P) 37.5 MG tablet; Take 1 tablet (37.5 mg total) by mouth every morning.  Dispense: 30 tablet; Refill: 2  7. Vitamin D deficiency - Vitamin D, Ergocalciferol, (DRISDOL) 1.25 MG (50000 UT) CAPS capsule; Take 1 capsule (50,000 Units total) by mouth every 7 (seven) days.  Dispense: 12 capsule; Refill: 4 - VITAMIN D 25 Hydroxy (Vit-D Deficiency, Fractures)   Labs pending Health Maintenance reviewed Diet and exercise encouraged  Follow up plan: 1 year   Evelina Dun, FNP

## 2019-03-08 LAB — VITAMIN D 25 HYDROXY (VIT D DEFICIENCY, FRACTURES): Vit D, 25-Hydroxy: 37.8 ng/mL (ref 30.0–100.0)

## 2019-05-07 ENCOUNTER — Other Ambulatory Visit: Payer: Self-pay | Admitting: Family

## 2019-05-07 DIAGNOSIS — E663 Overweight: Secondary | ICD-10-CM

## 2019-05-07 DIAGNOSIS — Z713 Dietary counseling and surveillance: Secondary | ICD-10-CM

## 2019-05-07 NOTE — Telephone Encounter (Signed)
Please advise on refill.

## 2019-05-14 ENCOUNTER — Other Ambulatory Visit: Payer: Self-pay | Admitting: Family Medicine

## 2019-05-14 DIAGNOSIS — Z8619 Personal history of other infectious and parasitic diseases: Secondary | ICD-10-CM

## 2019-05-14 MED ORDER — FLUCONAZOLE 150 MG PO TABS: ORAL_TABLET | ORAL | 0 refills | Status: DC

## 2019-06-11 ENCOUNTER — Encounter: Payer: Self-pay | Admitting: Nurse Practitioner

## 2019-06-11 ENCOUNTER — Other Ambulatory Visit: Payer: Self-pay

## 2019-06-11 ENCOUNTER — Ambulatory Visit (INDEPENDENT_AMBULATORY_CARE_PROVIDER_SITE_OTHER): Payer: Managed Care, Other (non HMO) | Admitting: Nurse Practitioner

## 2019-06-11 VITALS — BP 117/70 | HR 78 | Temp 97.7°F | Resp 20 | Ht 63.0 in | Wt 165.0 lb

## 2019-06-11 DIAGNOSIS — L989 Disorder of the skin and subcutaneous tissue, unspecified: Secondary | ICD-10-CM | POA: Diagnosis not present

## 2019-06-11 NOTE — Addendum Note (Signed)
Addended by: Chevis Pretty on: 06/11/2019 04:41 PM   Modules accepted: Orders

## 2019-06-11 NOTE — Patient Instructions (Signed)
Wound Care, Adult Taking care of your wound properly can help to prevent pain, infection, and scarring. It can also help your wound to heal more quickly. How to care for your wound Wound care      Follow instructions from your health care provider about how to take care of your wound. Make sure you: ? Wash your hands with soap and water before you change the bandage (dressing). If soap and water are not available, use hand sanitizer. ? Change your dressing as told by your health care provider. ? Leave stitches (sutures), skin glue, or adhesive strips in place. These skin closures may need to stay in place for 2 weeks or longer. If adhesive strip edges start to loosen and curl up, you may trim the loose edges. Do not remove adhesive strips completely unless your health care provider tells you to do that.  Check your wound area every day for signs of infection. Check for: ? Redness, swelling, or pain. ? Fluid or blood. ? Warmth. ? Pus or a bad smell.  Ask your health care provider if you should clean the wound with mild soap and water. Doing this may include: ? Using a clean towel to pat the wound dry after cleaning it. Do not rub or scrub the wound. ? Applying a cream or ointment. Do this only as told by your health care provider. ? Covering the incision with a clean dressing.  Ask your health care provider when you can leave the wound uncovered.  Keep the dressing dry until your health care provider says it can be removed. Do not take baths, swim, use a hot tub, or do anything that would put the wound underwater until your health care provider approves. Ask your health care provider if you can take showers. You may only be allowed to take sponge baths. Medicines   If you were prescribed an antibiotic medicine, cream, or ointment, take or use the antibiotic as told by your health care provider. Do not stop taking or using the antibiotic even if your condition improves.  Take  over-the-counter and prescription medicines only as told by your health care provider. If you were prescribed pain medicine, take it 30 or more minutes before you do any wound care or as told by your health care provider. General instructions  Return to your normal activities as told by your health care provider. Ask your health care provider what activities are safe.  Do not scratch or pick at the wound.  Do not use any products that contain nicotine or tobacco, such as cigarettes and e-cigarettes. These may delay wound healing. If you need help quitting, ask your health care provider.  Keep all follow-up visits as told by your health care provider. This is important.  Eat a diet that includes protein, vitamin A, vitamin C, and other nutrient-rich foods to help the wound heal. ? Foods rich in protein include meat, dairy, beans, nuts, and other sources. ? Foods rich in vitamin A include carrots and dark green, leafy vegetables. ? Foods rich in vitamin C include citrus, tomatoes, and other fruits and vegetables. ? Nutrient-rich foods have protein, carbohydrates, fat, vitamins, or minerals. Eat a variety of healthy foods including vegetables, fruits, and whole grains. Contact a health care provider if:  You received a tetanus shot and you have swelling, severe pain, redness, or bleeding at the injection site.  Your pain is not controlled with medicine.  You have redness, swelling, or pain around the wound.    You have fluid or blood coming from the wound.  Your wound feels warm to the touch.  You have pus or a bad smell coming from the wound.  You have a fever or chills.  You are nauseous or you vomit.  You are dizzy. Get help right away if:  You have a red streak going away from your wound.  The edges of the wound open up and separate.  Your wound is bleeding, and the bleeding does not stop with gentle pressure.  You have a rash.  You faint.  You have trouble breathing.  Summary  Always wash your hands with soap and water before changing your bandage (dressing).  To help with healing, eat foods that are rich in protein, vitamin A, vitamin C, and other nutrients.  Check your wound every day for signs of infection. Contact your health care provider if you suspect that your wound is infected. This information is not intended to replace advice given to you by your health care provider. Make sure you discuss any questions you have with your health care provider. Document Released: 04/04/2008 Document Revised: 10/14/2018 Document Reviewed: 01/11/2016 Elsevier Patient Education  2020 Elsevier Inc.  

## 2019-06-11 NOTE — Progress Notes (Signed)
   Subjective:    Patient ID: Jennifer Parsons, female    DOB: 01-15-1976, 43 y.o.   MRN: [id number]   Chief Complaint: Place on back of right leg   HPI Patient comes in with a dry slightly brownish lesion on right calf that she wants removed.   Review of Systems  Constitutional: Negative for activity change and appetite change.  HENT: Negative.   Eyes: Negative for pain.  Respiratory: Negative for shortness of breath.   Cardiovascular: Negative for chest pain, palpitations and leg swelling.  Gastrointestinal: Negative for abdominal pain.  Endocrine: Negative for polydipsia.  Genitourinary: Negative.   Skin: Negative for rash.  Neurological: Negative for dizziness, weakness and headaches.  Hematological: Does not bruise/bleed easily.  Psychiatric/Behavioral: Negative.   All other systems reviewed and are negative.      Objective:   Physical Exam Constitutional:      Appearance: Normal appearance.  Skin:    General: Skin is warm and dry.     Comments: 52mm annular slightly brown dry lesion on right calf  Neurological:     General: No focal deficit present.     Mental Status: She is alert and oriented to person, place, and time.  Psychiatric:        Mood and Affect: Mood normal.        Behavior: Behavior normal.    PROCEDURE  Consent:    Consent obtained: yes   Consent given by:  patient   Risks discussed:  possible infection   Alternatives discussed:  yes Wound    Type:  skin lesion   Size:  75mm annular   Location: right calf Pre-procedure details:    Skin preparation:  betadine Anesthesia     Anesthesia method:  local   Local anesthetic: lidocaine 2% with epi Procedure type:    Complexity:  simple Procedure details:    Incision types:  football shaped   Scalpel blade:  #11   Wound management:  closed with 3-0 ethilon 3 horizontal mattress stitch   Drainage appearance:  none   Drainage amount:  none   Wound treatment:  cleaned with saline   Packing  materials: none Post-procedure details:    Dressing: antibiotic ointment , 4x4 and coban   Patient tolerance of procedure:  well         Assessment & Plan:  Jennifer Parsons in today with chief complaint of Place on back of right leg   1. Skin lesion of right leg Keep clean and dry Keep covered Watch for signs of infection Suture removal in 10 days - skin biopsy  Mary-Margaret Hassell Done, FNP

## 2019-06-16 LAB — ANATOMIC PATHOLOGY REPORT: PDF Image: 0

## 2019-07-28 ENCOUNTER — Other Ambulatory Visit: Payer: Self-pay | Admitting: Family

## 2019-07-28 MED ORDER — PHENTERMINE HCL 37.5 MG PO TABS: 37.5000 mg | ORAL_TABLET | Freq: Every day | ORAL | 2 refills | Status: DC

## 2019-07-28 MED ORDER — FLUCONAZOLE 150 MG PO TABS: 150.0000 mg | ORAL_TABLET | ORAL | 0 refills | Status: DC | PRN

## 2019-07-28 NOTE — Progress Notes (Signed)
Will refill Phentermine for patient.

## 2019-12-15 ENCOUNTER — Other Ambulatory Visit: Payer: Self-pay

## 2019-12-15 ENCOUNTER — Encounter: Payer: Self-pay | Admitting: Family

## 2019-12-15 ENCOUNTER — Ambulatory Visit: Payer: Managed Care, Other (non HMO) | Admitting: Family

## 2019-12-15 VITALS — BP 119/75 | HR 59 | Temp 97.8°F | Ht 63.0 in | Wt 169.0 lb

## 2019-12-15 DIAGNOSIS — S80862A Insect bite (nonvenomous), left lower leg, initial encounter: Secondary | ICD-10-CM

## 2019-12-15 DIAGNOSIS — W57XXXA Bitten or stung by nonvenomous insect and other nonvenomous arthropods, initial encounter: Secondary | ICD-10-CM

## 2019-12-15 MED ORDER — DOXYCYCLINE HYCLATE 100 MG PO TABS: 200.0000 mg | ORAL_TABLET | Freq: Every day | ORAL | 0 refills | Status: DC

## 2019-12-15 NOTE — Patient Instructions (Signed)

## 2019-12-15 NOTE — Progress Notes (Signed)
   Subjective:    Patient ID: Jennifer Parsons, female    DOB: 03/16/1976, 44 y.o.   MRN: [id number]  Chief Complaint  Patient presents with  . tick bites   HPI Pt presents to the office today with tick bits on bilateral legs after she went to a pond. She reports she took off about 15 ticks off her over this weekend.   She denies any fever or rash. She reports she has fatigue and intermittent headache.    Review of Systems  All other systems reviewed and are negative.      Objective:   Physical Exam Vitals reviewed.  Constitutional:      General: She is not in acute distress.    Appearance: She is well-developed.  HENT:     Head: Normocephalic and atraumatic.     Right Ear: Tympanic membrane normal.     Left Ear: Tympanic membrane normal.  Eyes:     Pupils: Pupils are equal, round, and reactive to light.  Neck:     Thyroid: No thyromegaly.  Cardiovascular:     Rate and Rhythm: Normal rate and regular rhythm.     Heart sounds: Normal heart sounds. No murmur.  Pulmonary:     Effort: Pulmonary effort is normal. No respiratory distress.     Breath sounds: Normal breath sounds. No wheezing.  Abdominal:     General: Bowel sounds are normal. There is no distension.     Palpations: Abdomen is soft.     Tenderness: There is no abdominal tenderness.  Musculoskeletal:        General: No tenderness. Normal range of motion.     Cervical back: Normal range of motion and neck supple.  Skin:    General: Skin is warm and dry.     Findings: Lesion present.       Neurological:     Mental Status: She is alert and oriented to person, place, and time.     Cranial Nerves: No cranial nerve deficit.     Deep Tendon Reflexes: Reflexes are normal and symmetric.  Psychiatric:        Behavior: Behavior normal.        Thought Content: Thought content normal.        Judgment: Judgment normal.       BP 119/75   Pulse (!) 59   Temp 97.8 F (36.6 C) (Temporal)   Ht 5\' 3"  (1.6 m)   Wt  169 lb (76.7 kg)   SpO2 99%   BMI 29.94 kg/m      Assessment & Plan:  Jennifer Parsons comes in today with chief complaint of tick bites   Diagnosis and orders addressed:  1. Tick bite of left lower leg, initial encounter -Pt will get lab work in 2 weeks  -Pt to report any new fever, joint pain, or rash -Wear protective clothing while outside- Long sleeves and long pants -Put insect repellent on all exposed skin and along clothing -Take a shower as soon as possible after being outside RTO if symptoms worsen or do not improve - Rocky mtn spotted fvr abs pnl(IgG+IgM) - Lyme Ab/Western Blot Reflex - Alpha-Gal Panel - doxycycline (VIBRA-TABS) 100 MG tablet; Take 2 tablets (200 mg total) by mouth daily.  Dispense: 20 tablet; Refill: 0   Evelina Dun, FNP

## 2019-12-25 ENCOUNTER — Other Ambulatory Visit: Payer: Managed Care, Other (non HMO)

## 2019-12-25 ENCOUNTER — Other Ambulatory Visit: Payer: Self-pay | Admitting: Family

## 2019-12-25 ENCOUNTER — Other Ambulatory Visit: Payer: Self-pay

## 2019-12-25 DIAGNOSIS — R5383 Other fatigue: Secondary | ICD-10-CM

## 2019-12-25 DIAGNOSIS — R635 Abnormal weight gain: Secondary | ICD-10-CM

## 2019-12-25 MED ORDER — PHENTERMINE HCL 37.5 MG PO CAPS
37.5000 mg | ORAL_CAPSULE | ORAL | 2 refills | Status: DC
Start: 2019-12-25 — End: 2020-06-15

## 2019-12-26 LAB — VITAMIN B12: Vitamin B-12: 460 pg/mL (ref 232–1245)

## 2019-12-26 LAB — FSH/LH
FSH: 12.9 m[IU]/mL
LH: 15.5 m[IU]/mL

## 2019-12-26 LAB — TSH: TSH: 1.71 u[IU]/mL (ref 0.450–4.500)

## 2019-12-30 LAB — LYME AB/WESTERN BLOT REFLEX
LYME DISEASE AB, QUANT, IGM: <0.8 index (ref 0.00–0.79)
Lyme IgG/IgM Ab: <0.91 {ISR} (ref 0.00–0.90)

## 2019-12-30 LAB — ROCKY MTN SPOTTED FVR ABS PNL(IGG+IGM)
RMSF IgG: NEGATIVE
RMSF IgM: 0.2 index (ref 0.00–0.89)

## 2019-12-30 LAB — ALPHA-GAL PANEL
Alpha Gal IgE*: 16.5 kU/L — ABNORMAL HIGH (ref <0.10)
Beef (Bos spp) IgE: 5.91 kU/L — ABNORMAL HIGH (ref <0.35)
Class Interpretation: 2
Class Interpretation: 2
Class Interpretation: 3
Lamb/Mutton (Ovis spp) IgE: 0.8 kU/L — ABNORMAL HIGH (ref <0.35)
Pork (Sus spp) IgE: 3.2 kU/L — ABNORMAL HIGH (ref <0.35)

## 2020-01-04 LAB — ALLERGEN STINGING INSECT PANEL
Honeybee IgE: <0.1 kU/L
Hornet, White Face, IgE: 1.55 kU/L — AB
Hornet, Yellow, IgE: 0.78 kU/L — AB
Paper Wasp IgE: 0.79 kU/L — AB
Yellow Jacket, IgE: 2.51 kU/L — AB

## 2020-01-04 LAB — ALLERGEN, FLOUNDER, RF337: Allergen Flounder IgE: <0.1 kU/L

## 2020-01-04 LAB — ALLERGEN PROFILE, FOOD-FISH
Allergen Mackerel IgE: <0.1 kU/L
Allergen Salmon IgE: <0.1 kU/L
Allergen Trout IgE: <0.1 kU/L
Allergen Walley Pike IgE: <0.1 kU/L
Codfish IgE: <0.1 kU/L
Halibut IgE: <0.1 kU/L
Tuna: <0.1 kU/L

## 2020-01-04 LAB — FX05-IGE FOOD MIX: FX05-IgE Food Mix: NEGATIVE

## 2020-01-04 LAB — SPECIMEN STATUS REPORT

## 2020-01-08 ENCOUNTER — Other Ambulatory Visit: Payer: Self-pay | Admitting: Family

## 2020-01-08 MED ORDER — EPINEPHRINE 0.3 MG/0.3ML IJ SOAJ: 0.3000 mg | INTRAMUSCULAR | 2 refills | Status: DC | PRN

## 2020-02-20 ENCOUNTER — Ambulatory Visit (INDEPENDENT_AMBULATORY_CARE_PROVIDER_SITE_OTHER): Payer: Managed Care, Other (non HMO) | Admitting: Family

## 2020-02-20 ENCOUNTER — Encounter: Payer: Self-pay | Admitting: Family

## 2020-02-20 ENCOUNTER — Other Ambulatory Visit: Payer: Self-pay | Admitting: Family Medicine

## 2020-02-20 VITALS — BP 119/77 | HR 66 | Temp 97.6°F | Ht 63.0 in | Wt 166.0 lb

## 2020-02-20 DIAGNOSIS — Z23 Encounter for immunization: Secondary | ICD-10-CM | POA: Diagnosis not present

## 2020-02-20 DIAGNOSIS — Z91018 Allergy to other foods: Secondary | ICD-10-CM | POA: Diagnosis not present

## 2020-02-20 DIAGNOSIS — Z0001 Encounter for general adult medical examination with abnormal findings: Secondary | ICD-10-CM | POA: Diagnosis not present

## 2020-02-20 DIAGNOSIS — E663 Overweight: Secondary | ICD-10-CM | POA: Diagnosis not present

## 2020-02-20 DIAGNOSIS — F17200 Nicotine dependence, unspecified, uncomplicated: Secondary | ICD-10-CM | POA: Diagnosis not present

## 2020-02-20 DIAGNOSIS — Z91014 Allergy to mammalian meats: Secondary | ICD-10-CM

## 2020-02-20 DIAGNOSIS — Z0000 Encounter for general adult medical examination without abnormal findings: Secondary | ICD-10-CM

## 2020-02-20 DIAGNOSIS — R5383 Other fatigue: Secondary | ICD-10-CM

## 2020-02-20 LAB — BAYER DCA HB A1C WAIVED: HB A1C (BAYER DCA - WAIVED): 5.3 % (ref <7.0)

## 2020-02-20 MED ORDER — EPINEPHRINE 0.3 MG/0.3ML IJ SOAJ: 0.3000 mg | INTRAMUSCULAR | 2 refills | Status: DC | PRN

## 2020-02-20 NOTE — Patient Instructions (Signed)
Health Maintenance, Female Adopting a healthy lifestyle and getting preventive care are important in promoting health and wellness. Ask your health care provider about:  The right schedule for you to have regular tests and exams.  Things you can do on your own to prevent diseases and keep yourself healthy. What should I know about diet, weight, and exercise? Eat a healthy diet   Eat a diet that includes plenty of vegetables, fruits, low-fat dairy products, and lean protein.  Do not eat a lot of foods that are high in solid fats, added sugars, or sodium. Maintain a healthy weight Body mass index (BMI) is used to identify weight problems. It estimates body fat based on height and weight. Your health care provider can help determine your BMI and help you achieve or maintain a healthy weight. Get regular exercise Get regular exercise. This is one of the most important things you can do for your health. Most adults should:  Exercise for at least 150 minutes each week. The exercise should increase your heart rate and make you sweat (moderate-intensity exercise).  Do strengthening exercises at least twice a week. This is in addition to the moderate-intensity exercise.  Spend less time sitting. Even light physical activity can be beneficial. Watch cholesterol and blood lipids Have your blood tested for lipids and cholesterol at 43 years of age, then have this test every 5 years. Have your cholesterol levels checked more often if:  Your lipid or cholesterol levels are high.  You are older than 43 years of age.  You are at high risk for heart disease. What should I know about cancer screening? Depending on your health history and family history, you may need to have cancer screening at various ages. This may include screening for:  Breast cancer.  Cervical cancer.  Colorectal cancer.  Skin cancer.  Lung cancer. What should I know about heart disease, diabetes, and high blood  pressure? Blood pressure and heart disease  High blood pressure causes heart disease and increases the risk of stroke. This is more likely to develop in people who have high blood pressure readings, are of African descent, or are overweight.  Have your blood pressure checked: ? Every 3-5 years if you are 18-39 years of age. ? Every year if you are 40 years old or older. Diabetes Have regular diabetes screenings. This checks your fasting blood sugar level. Have the screening done:  Once every three years after age 40 if you are at a normal weight and have a low risk for diabetes.  More often and at a younger age if you are overweight or have a high risk for diabetes. What should I know about preventing infection? Hepatitis B If you have a higher risk for hepatitis B, you should be screened for this virus. Talk with your health care provider to find out if you are at risk for hepatitis B infection. Hepatitis C Testing is recommended for:  Everyone born from 1945 through 1965.  Anyone with known risk factors for hepatitis C. Sexually transmitted infections (STIs)  Get screened for STIs, including gonorrhea and chlamydia, if: ? You are sexually active and are younger than 43 years of age. ? You are older than 43 years of age and your health care provider tells you that you are at risk for this type of infection. ? Your sexual activity has changed since you were last screened, and you are at increased risk for chlamydia or gonorrhea. Ask your health care provider if   you are at risk.  Ask your health care provider about whether you are at high risk for HIV. Your health care provider may recommend a prescription medicine to help prevent HIV infection. If you choose to take medicine to prevent HIV, you should first get tested for HIV. You should then be tested every 3 months for as long as you are taking the medicine. Pregnancy  If you are about to stop having your period (premenopausal) and  you may become pregnant, seek counseling before you get pregnant.  Take 400 to 800 micrograms (mcg) of folic acid every day if you become pregnant.  Ask for birth control (contraception) if you want to prevent pregnancy. Osteoporosis and menopause Osteoporosis is a disease in which the bones lose minerals and strength with aging. This can result in bone fractures. If you are 65 years old or older, or if you are at risk for osteoporosis and fractures, ask your health care provider if you should:  Be screened for bone loss.  Take a calcium or vitamin D supplement to lower your risk of fractures.  Be given hormone replacement therapy (HRT) to treat symptoms of menopause. Follow these instructions at home: Lifestyle  Do not use any products that contain nicotine or tobacco, such as cigarettes, e-cigarettes, and chewing tobacco. If you need help quitting, ask your health care provider.  Do not use street drugs.  Do not share needles.  Ask your health care provider for help if you need support or information about quitting drugs. Alcohol use  Do not drink alcohol if: ? Your health care provider tells you not to drink. ? You are pregnant, may be pregnant, or are planning to become pregnant.  If you drink alcohol: ? Limit how much you use to 0-1 drink a day. ? Limit intake if you are breastfeeding.  Be aware of how much alcohol is in your drink. In the [place] one drink equals one 12 oz bottle of beer (355 mL), one 5 oz glass of wine (148 mL), or one 1 oz glass of hard liquor (44 mL). General instructions  Schedule regular health, dental, and eye exams.  Stay current with your vaccines.  Tell your health care provider if: ? You often feel depressed. ? You have ever been abused or do not feel safe at home. Summary  Adopting a healthy lifestyle and getting preventive care are important in promoting health and wellness.  Follow your health care provider's instructions about healthy  diet, exercising, and getting tested or screened for diseases.  Follow your health care provider's instructions on monitoring your cholesterol and blood pressure. This information is not intended to replace advice given to you by your health care provider. Make sure you discuss any questions you have with your health care provider. Document Revised: 06/19/2018 Document Reviewed: 06/19/2018 Elsevier Patient Education  2020 [employer] Inc.  

## 2020-02-20 NOTE — Progress Notes (Signed)
Subjective:    Patient ID: Jennifer Parsons, female    DOB: September 17, 1975, 44 y.o.   MRN: [id number]  Chief Complaint  Patient presents with  . Annual Exam    HPI Pt presents to the office today CPE without pap. She is not currently taking any medications. 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   Pt denies any headache, palpitations, SOB, or edema at this time.     Review of Systems  All other systems reviewed and are negative.  Family History  Problem Relation Age of Onset  . Breast cancer Paternal Aunt   . Esophageal cancer Paternal Grandfather   . Uterine cancer Mother   . Heart disease Maternal Grandmother   . Breast cancer Maternal Aunt   . COPD Father   . Ovarian cancer Paternal Aunt   . Stomach cancer Maternal Grandfather   . Heart disease Maternal Grandfather   . Diabetes Paternal Grandmother   . Hypertension Sister   . Hypertension Brother    Social History   Socioeconomic History  . Marital status: Married    Spouse name: Not on file  . Number of children: 1  . Years of education: Not on file  . Highest education level: Not on file  Occupational History  . 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  Tobacco Use  . Smoking status: Current Every Day Smoker    Packs/day: 0.25  . Smokeless tobacco: Never Used  Vaping Use  . Vaping Use: Never used  Substance and Sexual Activity  . Alcohol use: Yes    Comment: Occ.  . Drug use: No  . Sexual activity: Yes  Other Topics Concern  . Not on file  Social History Narrative  . Not on file   Social Determinants of Health   Financial Resource Strain:   . Difficulty of Paying Living Expenses:   Food Insecurity:   . Worried About Charity fundraiser in the Last Year:   . Arboriculturist in the Last Year:   Transportation Needs:   . Film/video editor (Medical):   Marland Kitchen Lack of Transportation (Non-Medical):   Physical Activity:   . Days of  Exercise per Week:   . Minutes of Exercise per Session:   Stress:   . Feeling of Stress :   Social Connections:   . Frequency of Communication with Friends and Family:   . Frequency of Social Gatherings with Friends and Family:   . Attends Religious Services:   . Active Member of Clubs or Organizations:   . Attends Archivist Meetings:   Marland Kitchen Marital Status:        Objective:   Physical Exam Vitals reviewed.  Constitutional:      General: She is not in acute distress.    Appearance: She is well-developed.  HENT:     Head: Normocephalic and atraumatic.     Right Ear: Tympanic membrane normal.     Left Ear: Tympanic membrane normal.  Eyes:     Pupils: Pupils are equal, round, and reactive to light.  Neck:     Thyroid: No thyromegaly.  Cardiovascular:     Rate and Rhythm: Normal rate and regular rhythm.     Heart sounds: Normal heart sounds. No murmur heard.   Pulmonary:     Effort: Pulmonary effort is normal. No respiratory distress.     Breath sounds: Normal breath sounds.  No wheezing.  Abdominal:     General: Bowel sounds are normal. There is no distension.     Palpations: Abdomen is soft.     Tenderness: There is no abdominal tenderness.  Musculoskeletal:        General: No tenderness. Normal range of motion.     Cervical back: Normal range of motion and neck supple.  Skin:    General: Skin is warm and dry.  Neurological:     Mental Status: She is alert and oriented to person, place, and time.     Cranial Nerves: No cranial nerve deficit.     Deep Tendon Reflexes: Reflexes are normal and symmetric.  Psychiatric:        Behavior: Behavior normal.        Thought Content: Thought content normal.        Judgment: Judgment normal.       BP 119/77   Pulse 66   Temp 97.6 F (36.4 C) (Temporal)   Ht 5\' 3"  (1.6 m)   Wt 166 lb (75.3 kg)   SpO2 99%   BMI 29.41 kg/m      Assessment & Plan:  Jennifer Parsons comes in today with chief complaint of Annual  Exam   Diagnosis and orders addressed:  1. Annual physical exam - EPINEPHrine 0.3 mg/0.3 mL IJ SOAJ injection; Inject 0.3 mLs (0.3 mg total) into the muscle as needed for anaphylaxis.  Dispense: 1 each; Refill: 2 - Tdap vaccine greater than or equal to 7yo IM  2. Current smoker Smoking cessation   3. Overweight (BMI 25.0-29.9)  4. 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  Dispense: 1 each; Refill: 2   Labs pending Health Maintenance reviewed Diet and exercise encouraged  Follow up plan: 1 year    Evelina Dun, FNP

## 2020-02-22 LAB — SARS-COV-2 SEMI-QUANTITATIVE TOTAL ANTIBODY, SPIKE
SARS-CoV-2 Semi-Quant Total Ab: <0.4 U/mL (ref <0.8)
SARS-CoV-2 Spike Ab Interp: NEGATIVE

## 2020-02-22 LAB — CMP14+EGFR
ALT: 21 IU/L (ref 0–32)
AST: 21 IU/L (ref 0–40)
Albumin/Globulin Ratio: 1.4 (ref 1.2–2.2)
Albumin: 3.8 g/dL (ref 3.8–4.8)
Alkaline Phosphatase: 97 IU/L (ref 48–121)
BUN/Creatinine Ratio: 11 (ref 9–23)
BUN: 11 mg/dL (ref 6–24)
Bilirubin Total: 0.3 mg/dL (ref 0.0–1.2)
CO2: 24 mmol/L (ref 20–29)
Calcium: 8.9 mg/dL (ref 8.7–10.2)
Chloride: 103 mmol/L (ref 96–106)
Creatinine, Ser: 1 mg/dL (ref 0.57–1.00)
GFR calc Af Amer: 80 mL/min/{1.73_m2} (ref >59)
GFR calc non Af Amer: 69 mL/min/{1.73_m2} (ref >59)
Globulin, Total: 2.7 g/dL (ref 1.5–4.5)
Glucose: 78 mg/dL (ref 65–99)
Potassium: 4.5 mmol/L (ref 3.5–5.2)
Sodium: 140 mmol/L (ref 134–144)
Total Protein: 6.5 g/dL (ref 6.0–8.5)

## 2020-02-22 LAB — LIPID PANEL
Chol/HDL Ratio: 2.4 ratio (ref 0.0–4.4)
Cholesterol, Total: 161 mg/dL (ref 100–199)
HDL: 66 mg/dL (ref >39)
LDL Chol Calc (NIH): 69 mg/dL (ref 0–99)
Triglycerides: 155 mg/dL — ABNORMAL HIGH (ref 0–149)
VLDL Cholesterol Cal: 26 mg/dL (ref 5–40)

## 2020-02-22 LAB — PTH, INTACT AND CALCIUM: PTH: 63 pg/mL (ref 15–65)

## 2020-02-22 LAB — THYROID PANEL WITH TSH
Free Thyroxine Index: 1.4 (ref 1.2–4.9)
T3 Uptake Ratio: 25 % (ref 24–39)
T4, Total: 5.6 ug/dL (ref 4.5–12.0)
TSH: 2.61 u[IU]/mL (ref 0.450–4.500)

## 2020-02-22 LAB — VITAMIN B12: Vitamin B-12: 293 pg/mL (ref 232–1245)

## 2020-02-22 LAB — SARS-COV-2 ANTIBODIES: SARS-CoV-2 Antibodies: NEGATIVE

## 2020-02-22 LAB — VITAMIN D 25 HYDROXY (VIT D DEFICIENCY, FRACTURES): Vit D, 25-Hydroxy: 28.9 ng/mL — ABNORMAL LOW (ref 30.0–100.0)

## 2020-02-23 ENCOUNTER — Other Ambulatory Visit: Payer: Self-pay | Admitting: Family

## 2020-02-23 MED ORDER — VITAMIN D (ERGOCALCIFEROL) 1.25 MG (50000 UNIT) PO CAPS
50000.0000 [IU] | ORAL_CAPSULE | ORAL | 3 refills | Status: DC
Start: 2020-02-23 — End: 2020-06-15

## 2020-02-26 ENCOUNTER — Encounter: Payer: Managed Care, Other (non HMO) | Admitting: Family

## 2020-03-01 LAB — ALPHA-GAL PANEL
Alpha Gal IgE*: 18.9 kU/L — ABNORMAL HIGH (ref <0.10)
Beef (Bos spp) IgE: 5.27 kU/L — ABNORMAL HIGH (ref <0.35)
Class Interpretation: 2
Class Interpretation: 2
Class Interpretation: 3
Lamb/Mutton (Ovis spp) IgE: 1.13 kU/L — ABNORMAL HIGH (ref <0.35)
Pork (Sus spp) IgE: 1.75 kU/L — ABNORMAL HIGH (ref <0.35)

## 2020-06-02 ENCOUNTER — Other Ambulatory Visit: Payer: Self-pay | Admitting: Family

## 2020-06-02 DIAGNOSIS — Z0000 Encounter for general adult medical examination without abnormal findings: Secondary | ICD-10-CM

## 2020-06-02 DIAGNOSIS — Z91014 Allergy to mammalian meats: Secondary | ICD-10-CM

## 2020-06-02 MED ORDER — EPINEPHRINE 0.3 MG/0.3ML IJ SOAJ: 0.3000 mg | INTRAMUSCULAR | 2 refills | Status: AC | PRN

## 2020-06-08 MED ORDER — FLUCONAZOLE 150 MG PO TABS: 150.0000 mg | ORAL_TABLET | Freq: Once | ORAL | 0 refills | Status: AC

## 2020-06-15 ENCOUNTER — Encounter: Payer: Self-pay | Admitting: Family

## 2020-06-15 ENCOUNTER — Ambulatory Visit (INDEPENDENT_AMBULATORY_CARE_PROVIDER_SITE_OTHER): Payer: Managed Care, Other (non HMO) | Admitting: Family

## 2020-06-15 DIAGNOSIS — B3731 Acute candidiasis of vulva and vagina: Secondary | ICD-10-CM

## 2020-06-15 DIAGNOSIS — E663 Overweight: Secondary | ICD-10-CM

## 2020-06-15 DIAGNOSIS — F17200 Nicotine dependence, unspecified, uncomplicated: Secondary | ICD-10-CM

## 2020-06-15 DIAGNOSIS — B373 Candidiasis of vulva and vagina: Secondary | ICD-10-CM

## 2020-06-15 DIAGNOSIS — Z713 Dietary counseling and surveillance: Secondary | ICD-10-CM

## 2020-06-15 MED ORDER — FLUCONAZOLE 150 MG PO TABS: 150.0000 mg | ORAL_TABLET | ORAL | 0 refills | Status: DC | PRN

## 2020-06-15 MED ORDER — PHENTERMINE HCL 37.5 MG PO CAPS: 37.5000 mg | ORAL_CAPSULE | ORAL | 2 refills | Status: DC

## 2020-06-15 NOTE — Progress Notes (Signed)
   Virtual Visit via telephone Note Due to COVID-19 pandemic this visit was conducted virtually. This visit type was conducted due to national recommendations for restrictions regarding the COVID-19 Pandemic (e.g. social distancing, sheltering in place) in an effort to limit this patient's exposure and mitigate transmission in our community. All issues noted in this document were discussed and addressed.  A physical exam was not performed with this format.  I connected with Jennifer Parsons on 06/15/20 at 2:30 pm  by telephone and verified that I am speaking with the correct person using two identifiers. Jennifer Parsons is currently located at work  and no one is currently with her during visit. The provider, Evelina Dun, FNP is located in their office at time of visit.  I discussed the limitations, risks, security and privacy concerns of performing an evaluation and management service by telephone and the availability of in person appointments. I also discussed with the patient that there may be a patient responsible charge related to this service. The patient expressed understanding and agreed to proceed.   History and Present Illness:  Pt calling the office requesting phentermine refill. She reports she has not had this rx in several months. She reports she has cut out all sodas. She has switched her job and is sitting a lot more. She has been trying do exercise and change her diet healthy.  Vaginal Discharge The patient's primary symptoms include genital itching and vaginal discharge. This is a new problem. The current episode started 1 to 4 weeks ago. The problem occurs intermittently. The vaginal discharge was milky.       Review of Systems  Genitourinary: Positive for vaginal discharge.     Observations/Objective: No SOB or distress noted   Assessment and Plan: 1. Vagina, candidiasis Keep clean and dry  - fluconazole (DIFLUCAN) 150 MG tablet; Take 1 tablet (150 mg total) by mouth  every three (3) days as needed.  Dispense: 3 tablet; Refill: 0  2. Overweight (BMI 25.0-29.9) - phentermine 37.5 MG capsule; Take 1 capsule (37.5 mg total) by mouth every morning.  Dispense: 30 capsule; Refill: 2  3. Current smoker  4. Weight loss counseling, encounter for Start phentermine  Must lose 5% of weight to continue medication  Encourage exercise and healthy diet - phentermine 37.5 MG capsule; Take 1 capsule (37.5 mg total) by mouth every morning.  Dispense: 30 capsule; Refill: 2      I discussed the assessment and treatment plan with the patient. The patient was provided an opportunity to ask questions and all were answered. The patient agreed with the plan and demonstrated an understanding of the instructions.   The patient was advised to call back or seek an in-person evaluation if the symptoms worsen or if the condition fails to improve as anticipated.  The above assessment and management plan was discussed with the patient. The patient verbalized understanding of and has agreed to the management plan. Patient is aware to call the clinic if symptoms persist or worsen. Patient is aware when to return to the clinic for a follow-up visit. Patient educated on when it is appropriate to go to the emergency department.   Time call ended:  2:43 pm   I provided 13 minutes of non-face-to-face time during this encounter.    Evelina Dun, FNP

## 2020-08-16 ENCOUNTER — Other Ambulatory Visit: Payer: Self-pay | Admitting: Family

## 2020-08-16 DIAGNOSIS — E663 Overweight: Secondary | ICD-10-CM

## 2020-08-16 DIAGNOSIS — Z713 Dietary counseling and surveillance: Secondary | ICD-10-CM

## 2020-10-27 ENCOUNTER — Other Ambulatory Visit: Payer: Self-pay

## 2020-10-27 ENCOUNTER — Other Ambulatory Visit: Payer: Self-pay | Admitting: Family

## 2020-10-27 DIAGNOSIS — B3731 Acute candidiasis of vulva and vagina: Secondary | ICD-10-CM

## 2020-10-27 DIAGNOSIS — Z713 Dietary counseling and surveillance: Secondary | ICD-10-CM

## 2020-10-27 DIAGNOSIS — E663 Overweight: Secondary | ICD-10-CM

## 2020-10-27 DIAGNOSIS — B373 Candidiasis of vulva and vagina: Secondary | ICD-10-CM

## 2020-10-27 NOTE — Telephone Encounter (Signed)
Last office visit 06/15/20 Last refill 06/15/20, #30, 2 refills

## 2020-10-28 ENCOUNTER — Ambulatory Visit (INDEPENDENT_AMBULATORY_CARE_PROVIDER_SITE_OTHER): Payer: Managed Care, Other (non HMO) | Admitting: Family

## 2020-10-28 ENCOUNTER — Encounter: Payer: Self-pay | Admitting: Family

## 2020-10-28 DIAGNOSIS — J019 Acute sinusitis, unspecified: Secondary | ICD-10-CM | POA: Diagnosis not present

## 2020-10-28 DIAGNOSIS — B3731 Acute candidiasis of vulva and vagina: Secondary | ICD-10-CM

## 2020-10-28 DIAGNOSIS — B373 Candidiasis of vulva and vagina: Secondary | ICD-10-CM | POA: Diagnosis not present

## 2020-10-28 MED ORDER — FLUTICASONE PROPIONATE 50 MCG/ACT NA SUSP: 2.0000 | Freq: Every day | NASAL | 6 refills | Status: DC

## 2020-10-28 MED ORDER — FLUCONAZOLE 150 MG PO TABS: 150.0000 mg | ORAL_TABLET | ORAL | 0 refills | Status: DC | PRN

## 2020-10-28 MED ORDER — PREDNISONE 10 MG (21) PO TBPK: ORAL_TABLET | ORAL | 0 refills | Status: DC

## 2020-10-28 MED ORDER — CETIRIZINE HCL 10 MG PO TABS: 10.0000 mg | ORAL_TABLET | Freq: Every day | ORAL | 11 refills | Status: DC

## 2020-10-28 NOTE — Progress Notes (Signed)
Virtual Visit  Note Due to COVID-19 pandemic this visit was conducted virtually. This visit type was conducted due to national recommendations for restrictions regarding the COVID-19 Pandemic (e.g. social distancing, sheltering in place) in an effort to limit this patient's exposure and mitigate transmission in our community. All issues noted in this document were discussed and addressed.  A physical exam was not performed with this format.  I connected with Jennifer Parsons on 10/28/20 at 12:12 pm  by telephone and verified that I am speaking with the correct person using two identifiers. Jennifer Parsons is currently located at car and no one is currently with her during visit. The provider, Evelina Dun, FNP is located in their office at time of visit.  I discussed the limitations, risks, security and privacy concerns of performing an evaluation and management service by telephone and the availability of in person appointments. I also discussed with the patient that there may be a patient responsible charge related to this service. The patient expressed understanding and agreed to proceed.   History and Present Illness:  Pt calls the office today with sinus congestion after cleaning out an old building. She states she takes a weekly COVID test and it was negative. Sinusitis This is a new problem. The current episode started 1 to 4 weeks ago. The problem has been gradually worsening since onset. There has been no fever. Associated symptoms include congestion, coughing, ear pain, headaches, sinus pressure and sneezing. Pertinent negatives include no sore throat. Past treatments include acetaminophen. The treatment provided mild relief.       Review of Systems  HENT: Positive for congestion, ear pain, sinus pressure and sneezing. Negative for sore throat.   Respiratory: Positive for cough.   Neurological: Positive for headaches.     Observations/Objective: No SOB or distress noted, hoarse  voice   Assessment and Plan: 1. Acute sinusitis, recurrence not specified, unspecified location - Take meds as prescribed - Use a cool mist humidifier  -Use saline nose sprays frequently -Force fluids -For any cough or congestion  Use plain Mucinex- regular strength or max strength is fine -For fever or aces or pains- take tylenol or ibuprofen. -Throat lozenges if help - predniSONE (STERAPRED UNI-PAK 21 TAB) 10 MG (21) TBPK tablet; Use as directed  Dispense: 21 tablet; Refill: 0 - cetirizine (ZYRTEC) 10 MG tablet; Take 1 tablet (10 mg total) by mouth daily.  Dispense: 30 tablet; Refill: 11 - fluticasone (FLONASE) 50 MCG/ACT nasal spray; Place 2 sprays into both nostrils daily.  Dispense: 16 g; Refill: 6  2. Vagina, candidiasis - fluconazole (DIFLUCAN) 150 MG tablet; Take 1 tablet (150 mg total) by mouth every three (3) days as needed.  Dispense: 3 tablet; Refill: 0     I discussed the assessment and treatment plan with the patient. The patient was provided an opportunity to ask questions and all were answered. The patient agreed with the plan and demonstrated an understanding of the instructions.   The patient was advised to call back or seek an in-person evaluation if the symptoms worsen or if the condition fails to improve as anticipated.  The above assessment and management plan was discussed with the patient. The patient verbalized understanding of and has agreed to the management plan. Patient is aware to call the clinic if symptoms persist or worsen. Patient is aware when to return to the clinic for a follow-up visit. Patient educated on when it is appropriate to go to the emergency department.  Time call ended:  12:23 pm  I provided 11 minutes of  non face-to-face time during this encounter.    Evelina Dun, FNP

## 2021-02-15 ENCOUNTER — Encounter: Payer: Self-pay | Admitting: Family

## 2021-02-15 ENCOUNTER — Ambulatory Visit: Payer: Managed Care, Other (non HMO) | Admitting: Family

## 2021-02-15 ENCOUNTER — Other Ambulatory Visit: Payer: Self-pay

## 2021-02-15 VITALS — BP 133/87 | HR 87 | Temp 98.7°F | Ht 63.0 in | Wt 167.4 lb

## 2021-02-15 DIAGNOSIS — R5383 Other fatigue: Secondary | ICD-10-CM

## 2021-02-15 DIAGNOSIS — Z114 Encounter for screening for human immunodeficiency virus [HIV]: Secondary | ICD-10-CM

## 2021-02-15 DIAGNOSIS — Z01419 Encounter for gynecological examination (general) (routine) without abnormal findings: Secondary | ICD-10-CM

## 2021-02-15 DIAGNOSIS — Z0000 Encounter for general adult medical examination without abnormal findings: Secondary | ICD-10-CM

## 2021-02-15 DIAGNOSIS — Z0001 Encounter for general adult medical examination with abnormal findings: Secondary | ICD-10-CM | POA: Diagnosis not present

## 2021-02-15 DIAGNOSIS — Z1159 Encounter for screening for other viral diseases: Secondary | ICD-10-CM

## 2021-02-15 LAB — BAYER DCA HB A1C WAIVED: HB A1C (BAYER DCA - WAIVED): 5.3 % (ref <7.0)

## 2021-02-15 NOTE — Patient Instructions (Signed)
Health Maintenance, Female Adopting a healthy lifestyle and getting preventive care are important in promoting health and wellness. Ask your health care provider about: The right schedule for you to have regular tests and exams. Things you can do on your own to prevent diseases and keep yourself healthy. What should I know about diet, weight, and exercise? Eat a healthy diet  Eat a diet that includes plenty of vegetables, fruits, low-fat dairy products, and lean protein. Do not eat a lot of foods that are high in solid fats, added sugars, or sodium.  Maintain a healthy weight Body mass index (BMI) is used to identify weight problems. It estimates body fat based on height and weight. Your health care provider can help determineyour BMI and help you achieve or maintain a healthy weight. Get regular exercise Get regular exercise. This is one of the most important things you can do for your health. Most adults should: Exercise for at least 150 minutes each week. The exercise should increase your heart rate and make you sweat (moderate-intensity exercise). Do strengthening exercises at least twice a week. This is in addition to the moderate-intensity exercise. Spend less time sitting. Even light physical activity can be beneficial. Watch cholesterol and blood lipids Have your blood tested for lipids and cholesterol at 44 years of age, then havethis test every 5 years. Have your cholesterol levels checked more often if: Your lipid or cholesterol levels are high. You are older than 44 years of age. You are at high risk for heart disease. What should I know about cancer screening? Depending on your health history and family history, you may need to have cancer screening at various ages. This may include screening for: Breast cancer. Cervical cancer. Colorectal cancer. Skin cancer. Lung cancer. What should I know about heart disease, diabetes, and high blood pressure? Blood pressure and heart  disease High blood pressure causes heart disease and increases the risk of stroke. This is more likely to develop in people who have high blood pressure readings, are of African descent, or are overweight. Have your blood pressure checked: Every 3-5 years if you are 18-39 years of age. Every year if you are 40 years old or older. Diabetes Have regular diabetes screenings. This checks your fasting blood sugar level. Have the screening done: Once every three years after age 40 if you are at a normal weight and have a low risk for diabetes. More often and at a younger age if you are overweight or have a high risk for diabetes. What should I know about preventing infection? Hepatitis B If you have a higher risk for hepatitis B, you should be screened for this virus. Talk with your health care provider to find out if you are at risk forhepatitis B infection. Hepatitis C Testing is recommended for: Everyone born from 1945 through 1965. Anyone with known risk factors for hepatitis C. Sexually transmitted infections (STIs) Get screened for STIs, including gonorrhea and chlamydia, if: You are sexually active and are younger than 44 years of age. You are older than 44 years of age and your health care provider tells you that you are at risk for this type of infection. Your sexual activity has changed since you were last screened, and you are at increased risk for chlamydia or gonorrhea. Ask your health care provider if you are at risk. Ask your health care provider about whether you are at high risk for HIV. Your health care provider may recommend a prescription medicine to help   prevent HIV infection. If you choose to take medicine to prevent HIV, you should first get tested for HIV. You should then be tested every 3 months for as long as you are taking the medicine. Pregnancy If you are about to stop having your period (premenopausal) and you may become pregnant, seek counseling before you get  pregnant. Take 400 to 800 micrograms (mcg) of folic acid every day if you become pregnant. Ask for birth control (contraception) if you want to prevent pregnancy. Osteoporosis and menopause Osteoporosis is a disease in which the bones lose minerals and strength with aging. This can result in bone fractures. If you are 65 years old or older, or if you are at risk for osteoporosis and fractures, ask your health care provider if you should: Be screened for bone loss. Take a calcium or vitamin D supplement to lower your risk of fractures. Be given hormone replacement therapy (HRT) to treat symptoms of menopause. Follow these instructions at home: Lifestyle Do not use any products that contain nicotine or tobacco, such as cigarettes, e-cigarettes, and chewing tobacco. If you need help quitting, ask your health care provider. Do not use street drugs. Do not share needles. Ask your health care provider for help if you need support or information about quitting drugs. Alcohol use Do not drink alcohol if: Your health care provider tells you not to drink. You are pregnant, may be pregnant, or are planning to become pregnant. If you drink alcohol: Limit how much you use to 0-1 drink a day. Limit intake if you are breastfeeding. Be aware of how much alcohol is in your drink. In the [place] one drink equals one 12 oz bottle of beer (355 mL), one 5 oz glass of wine (148 mL), or one 1 oz glass of hard liquor (44 mL). General instructions Schedule regular health, dental, and eye exams. Stay current with your vaccines. Tell your health care provider if: You often feel depressed. You have ever been abused or do not feel safe at home. Summary Adopting a healthy lifestyle and getting preventive care are important in promoting health and wellness. Follow your health care provider's instructions about healthy diet, exercising, and getting tested or screened for diseases. Follow your health care provider's  instructions on monitoring your cholesterol and blood pressure. This information is not intended to replace advice given to you by your health care provider. Make sure you discuss any questions you have with your healthcare provider. Document Revised: 06/19/2018 Document Reviewed: 06/19/2018 Elsevier Patient Education  2022 Elsevier Inc.  

## 2021-02-15 NOTE — Addendum Note (Signed)
Addended by: Ladean Raya on: 02/15/2021 02:42 PM   Modules accepted: Orders

## 2021-02-15 NOTE — Progress Notes (Signed)
Subjective:    Patient ID: Jennifer Parsons, female    DOB: May 26, 1976, 45 y.o.   MRN: [id number]  Chief Complaint  Patient presents with   Annual Exam    Pap    PT presents to the office today for CPE with pap. She reports she having fatigue and brain fog.  Gynecologic Exam The patient's pertinent negatives include no genital itching, vaginal bleeding or vaginal discharge.  Gastroesophageal Reflux She complains of belching and heartburn. This is a chronic problem. The current episode started more than 1 year ago. The problem occurs occasionally. She has tried a PPI for the symptoms. The treatment provided moderate relief.  Nicotine Dependence Presents for follow-up visit. Her urge triggers include company of smokers. The symptoms have been stable. She smokes < 1/2 a pack of cigarettes per day.     Review of Systems  Gastrointestinal:  Positive for heartburn.  Genitourinary:  Negative for vaginal discharge.  All other systems reviewed and are negative.  Family History  Problem Relation Age of Onset   Breast cancer Paternal Aunt    Esophageal cancer Paternal Grandfather    Uterine cancer Mother    Heart disease Maternal Grandmother    Breast cancer Maternal Aunt    COPD Father    Ovarian cancer Paternal Aunt    Stomach cancer Maternal Grandfather    Heart disease Maternal Grandfather    Diabetes Paternal Grandmother    Hypertension Sister    Hypertension Brother    Social History   Socioeconomic History   Marital status: Married    Spouse name: Not on file   Number of children: 1   Years of education: Not on file   Highest education level: Not on file  Occupational History   Occupation: Engineer, 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  Tobacco Use   Smoking status: Every Day    Packs/day: 0.25    Types: Cigarettes   Smokeless tobacco: Never  Vaping Use   Vaping Use: Never used  Substance and Sexual Activity   Alcohol use: Yes    Comment: Occ.   Drug  use: No   Sexual activity: Yes  Other Topics Concern   Not on file  Social History Narrative   Not on file   Social Determinants of Health   Financial Resource Strain: Not on file  Food Insecurity: Not on file  Transportation Needs: Not on file  Physical Activity: Not on file  Stress: Not on file  Social Connections: Not on file       Objective:   Physical Exam Vitals reviewed.  Constitutional:      General: She is not in acute distress.    Appearance: She is well-developed.  HENT:     Head: Normocephalic and atraumatic.     Right Ear: External ear normal.  Eyes:     Pupils: Pupils are equal, round, and reactive to light.  Neck:     Thyroid: No thyromegaly.  Cardiovascular:     Rate and Rhythm: Normal rate and regular rhythm.     Heart sounds: Normal heart sounds. No murmur heard. Pulmonary:     Effort: Pulmonary effort is normal. No respiratory distress.     Breath sounds: Normal breath sounds. No wheezing.  Abdominal:     General: Bowel sounds are normal. There is no distension.     Palpations: Abdomen is soft.     Tenderness: There is no abdominal tenderness.  Genitourinary:    General:  Normal vulva.     Comments: Bimanual exam- no adnexal masses or tenderness, ovaries nonpalpable   Cervix parous and pink- No discharge  Musculoskeletal:        General: No tenderness. Normal range of motion.     Cervical back: Normal range of motion and neck supple.  Skin:    General: Skin is warm and dry.  Neurological:     Mental Status: She is alert and oriented to person, place, and time.     Cranial Nerves: No cranial nerve deficit.     Deep Tendon Reflexes: Reflexes are normal and symmetric.  Psychiatric:        Behavior: Behavior normal.        Thought Content: Thought content normal.        Judgment: Judgment normal.      BP 133/87   Pulse 87   Temp 98.7 F (37.1 C) (Temporal)   Ht _0  (1.6 m)   Wt 167 lb 6.4 oz (75.9 kg)   SpO2 98%   BMI 29.65 kg/m       Assessment & Plan:  NAZARET CHEA comes in today with chief complaint of Annual Exam (Pap )   Diagnosis and orders addressed:  1. Annual physical exam - CMP14+EGFR - Anemia Profile B - Lipid panel - TSH - HIV Antibody (routine testing w rflx) - Hepatitis Panel (REFL) - Cytology - PAP([place])  2. Encounter for gynecological examination without abnormal finding - CMP14+EGFR - Anemia Profile B - Cytology - PAP([place])  3. Fatigue, unspecified type - CMP14+EGFR - Anemia Profile B  4. Encounter for screening for HIV - CMP14+EGFR - Anemia Profile B - HIV Antibody (routine testing w rflx)  5. Need for hepatitis C screening test - CMP14+EGFR - Anemia Profile B - Hepatitis Panel (REFL)   Labs pending Health Maintenance reviewed Diet and exercise encouraged  Follow up plan: 1 year    Evelina Dun, FNP

## 2021-02-16 LAB — LIPID PANEL
Chol/HDL Ratio: 2.3 ratio (ref 0.0–4.4)
Cholesterol, Total: 181 mg/dL (ref 100–199)
HDL: 79 mg/dL (ref >39)
LDL Chol Calc (NIH): 89 mg/dL (ref 0–99)
Triglycerides: 69 mg/dL (ref 0–149)
VLDL Cholesterol Cal: 13 mg/dL (ref 5–40)

## 2021-02-16 LAB — ACUTE VIRAL HEPATITIS (HAV, HBV, HCV)
HCV Ab: <0.1 s/co ratio (ref 0.0–0.9)
Hep A IgM: NEGATIVE
Hep B C IgM: NEGATIVE
Hepatitis B Surface Ag: NEGATIVE

## 2021-02-16 LAB — PAP IG (IMAGE GUIDED)

## 2021-02-16 LAB — ANEMIA PROFILE B
Basophils Absolute: 0.1 10*3/uL (ref 0.0–0.2)
Basos: 1 %
EOS (ABSOLUTE): 0.4 10*3/uL (ref 0.0–0.4)
Eos: 5 %
Ferritin: 46 ng/mL (ref 15–150)
Folate: 5.2 ng/mL (ref >3.0)
Hematocrit: 42 % (ref 34.0–46.6)
Hemoglobin: 14 g/dL (ref 11.1–15.9)
Immature Grans (Abs): 0 10*3/uL (ref 0.0–0.1)
Immature Granulocytes: 0 %
Iron Saturation: 14 % — ABNORMAL LOW (ref 15–55)
Iron: 45 ug/dL (ref 27–159)
Lymphocytes Absolute: 3.2 10*3/uL — ABNORMAL HIGH (ref 0.7–3.1)
Lymphs: 36 %
MCH: 31.7 pg (ref 26.6–33.0)
MCHC: 33.3 g/dL (ref 31.5–35.7)
MCV: 95 fL (ref 79–97)
Monocytes Absolute: 0.6 10*3/uL (ref 0.1–0.9)
Monocytes: 7 %
Neutrophils Absolute: 4.4 10*3/uL (ref 1.4–7.0)
Neutrophils: 51 %
Platelets: 325 10*3/uL (ref 150–450)
RBC: 4.42 x10E6/uL (ref 3.77–5.28)
RDW: 12.2 % (ref 11.7–15.4)
Retic Ct Pct: 1.5 % (ref 0.6–2.6)
Total Iron Binding Capacity: 329 ug/dL (ref 250–450)
UIBC: 284 ug/dL (ref 131–425)
Vitamin B-12: 382 pg/mL (ref 232–1245)
WBC: 8.8 10*3/uL (ref 3.4–10.8)

## 2021-02-16 LAB — TSH: TSH: 2.46 u[IU]/mL (ref 0.450–4.500)

## 2021-02-16 LAB — CMP14+EGFR
ALT: 24 IU/L (ref 0–32)
AST: 24 IU/L (ref 0–40)
Albumin/Globulin Ratio: 1.6 (ref 1.2–2.2)
Albumin: 4.3 g/dL (ref 3.8–4.8)
Alkaline Phosphatase: 114 IU/L (ref 44–121)
BUN/Creatinine Ratio: 19 (ref 9–23)
BUN: 18 mg/dL (ref 6–24)
Bilirubin Total: 0.2 mg/dL (ref 0.0–1.2)
CO2: 25 mmol/L (ref 20–29)
Calcium: 9.4 mg/dL (ref 8.7–10.2)
Chloride: 103 mmol/L (ref 96–106)
Creatinine, Ser: 0.95 mg/dL (ref 0.57–1.00)
Globulin, Total: 2.7 g/dL (ref 1.5–4.5)
Glucose: 86 mg/dL (ref 65–99)
Potassium: 4.7 mmol/L (ref 3.5–5.2)
Sodium: 140 mmol/L (ref 134–144)
Total Protein: 7 g/dL (ref 6.0–8.5)
eGFR: 76 mL/min/{1.73_m2} (ref >59)

## 2021-02-16 LAB — HCV INTERPRETATION

## 2021-02-16 LAB — HIV ANTIBODY (ROUTINE TESTING W REFLEX): HIV Screen 4th Generation wRfx: NONREACTIVE

## 2021-02-21 ENCOUNTER — Other Ambulatory Visit: Payer: Self-pay | Admitting: Family

## 2021-02-21 MED ORDER — PHENTERMINE HCL 37.5 MG PO TABS: 37.5000 mg | ORAL_TABLET | Freq: Every day | ORAL | 2 refills | Status: DC

## 2021-02-23 ENCOUNTER — Other Ambulatory Visit: Payer: Self-pay | Admitting: Family

## 2021-02-23 DIAGNOSIS — Z1231 Encounter for screening mammogram for malignant neoplasm of breast: Secondary | ICD-10-CM

## 2021-02-23 LAB — SPECIMEN STATUS REPORT

## 2021-02-23 LAB — SARS-COV-2 SEMI-QUANTITATIVE TOTAL ANTIBODY, SPIKE
SARS-CoV-2 Semi-Quant Total Ab: <0.8 U/mL (ref <0.8)
SARS-CoV-2 Spike Ab Interp: NEGATIVE

## 2021-02-24 LAB — ALPHA-GAL PANEL
Allergen Lamb IgE: 1.15 kU/L — AB
Beef IgE: 3.47 kU/L — AB
IgE (Immunoglobulin E), Serum: 1410 IU/mL — ABNORMAL HIGH (ref 6–495)
O215-IgE Alpha-Gal: 12.7 kU/L — AB
Pork IgE: 2.18 kU/L — AB

## 2021-02-24 LAB — SPECIMEN STATUS REPORT

## 2021-04-11 ENCOUNTER — Ambulatory Visit
Admission: RE | Admit: 2021-04-11 | Discharge: 2021-04-11 | Disposition: A | Discharge status: Home / Self Care | Payer: Managed Care, Other (non HMO) | Source: Ambulatory Visit | Attending: Family | Admitting: Family

## 2021-04-11 ENCOUNTER — Other Ambulatory Visit: Payer: Self-pay

## 2021-04-11 DIAGNOSIS — Z1231 Encounter for screening mammogram for malignant neoplasm of breast: Secondary | ICD-10-CM

## 2021-04-30 ENCOUNTER — Other Ambulatory Visit: Payer: Self-pay | Admitting: Family

## 2021-06-03 ENCOUNTER — Other Ambulatory Visit: Payer: Self-pay | Admitting: Family

## 2021-06-03 NOTE — Telephone Encounter (Signed)
Last office visit 02/15/21 Last refill 02/21/21, #30, 2 refills

## 2021-08-18 ENCOUNTER — Ambulatory Visit: Payer: Managed Care, Other (non HMO) | Admitting: Family

## 2022-07-10 HISTORY — PX: OTHER SURGICAL HISTORY: SHX169

## 2022-09-07 ENCOUNTER — Ambulatory Visit: Payer: Managed Care, Other (non HMO)

## 2023-02-15 ENCOUNTER — Other Ambulatory Visit (HOSPITAL_COMMUNITY)
Admission: RE | Admit: 2023-02-15 | Discharge: 2023-02-15 | Disposition: A | Discharge status: Home / Self Care | Payer: Managed Care, Other (non HMO) | Source: Ambulatory Visit | Attending: Family | Admitting: Family

## 2023-02-15 ENCOUNTER — Encounter: Payer: Self-pay | Admitting: Family

## 2023-02-15 ENCOUNTER — Ambulatory Visit: Payer: Managed Care, Other (non HMO) | Admitting: Family

## 2023-02-15 VITALS — BP 129/80 | HR 89 | Temp 97.6°F | Ht 63.0 in | Wt 162.4 lb

## 2023-02-15 DIAGNOSIS — Z01419 Encounter for gynecological examination (general) (routine) without abnormal findings: Secondary | ICD-10-CM

## 2023-02-15 DIAGNOSIS — Z01411 Encounter for gynecological examination (general) (routine) with abnormal findings: Secondary | ICD-10-CM | POA: Diagnosis not present

## 2023-02-15 DIAGNOSIS — F17200 Nicotine dependence, unspecified, uncomplicated: Secondary | ICD-10-CM | POA: Diagnosis not present

## 2023-02-15 DIAGNOSIS — Z0000 Encounter for general adult medical examination without abnormal findings: Secondary | ICD-10-CM | POA: Insufficient documentation

## 2023-02-15 DIAGNOSIS — E663 Overweight: Secondary | ICD-10-CM | POA: Diagnosis not present

## 2023-02-15 DIAGNOSIS — N950 Postmenopausal bleeding: Secondary | ICD-10-CM

## 2023-02-15 LAB — WET PREP FOR TRICH, YEAST, CLUE
Clue Cell Exam: NEGATIVE
Trichomonas Exam: NEGATIVE
Yeast Exam: NEGATIVE

## 2023-02-15 NOTE — Patient Instructions (Signed)
Postmenopausal Bleeding Postmenopausal bleeding is any bleeding that a woman has after she has entered menopause. Menopause is the end of a woman's fertile years. After menopause, a woman no longer ovulates and does not have menstrual periods. Therefore, she should no longer have bleeding from her vagina. Postmenopausal bleeding may have various causes, including: Menopausal hormone therapy (MHT). Endometrial atrophy. After menopause, low estrogen hormone levels cause the membrane that lines the uterus (endometrium) to become thin. You may have bleeding as the endometrium thins. Endometrial hyperplasia. This condition is caused by excess estrogen hormones and low levels of progesterone hormones. The excess estrogen causes the endometrium to thicken, which can lead to bleeding. In some cases, this can lead to cancer of the uterus. Endometrial cancer. Noncancerous growths (polyps) on the endometrium, the lining of the uterus, or the cervix. Uterine fibroids. These are noncancerous growths in or around the uterus muscle tissue that can cause heavy bleeding. Any type of postmenopausal bleeding, even if it appears to be a typical menstrual period, should be checked by your health care provider. Treatment will depend on the cause of the bleeding. Follow these instructions at home:  Pay attention to any changes in your symptoms. Let your health care provider know about them. Avoid using tampons and douches as told by your health care provider. Change your pads regularly. Get regular pelvic exams, including Pap tests, as told by your health care provider. Take iron supplements as told by your health care provider. Take over-the-counter and prescription medicines only as told by your health care provider. Keep all follow-up visits. This is important. Contact a health care provider if: You have new bleeding from the vagina after menopause. You have pain in your abdomen. Get help right away if: You have  a fever or chills. You have severe pain with bleeding. You are passing blood clots. You have heavy bleeding, need more than 1 pad an hour, and have never experienced this before. You have headaches or feel faint or dizzy. Summary Postmenopausal bleeding is any bleeding that a woman has after she has entered into menopause. Postmenopausal bleeding may have various causes. Treatment will depend on the cause of the bleeding. Any type of postmenopausal bleeding, even if it appears to be a typical menstrual period, should be checked by your health care provider. Be sure to pay attention to any changes in your symptoms and keep all follow-up visits. This information is not intended to replace advice given to you by your health care provider. Make sure you discuss any questions you have with your health care provider. Document Revised: 12/11/2019 Document Reviewed: 12/11/2019 Elsevier Patient Education  2024 [employer].

## 2023-02-15 NOTE — Progress Notes (Signed)
Subjective:    Patient ID: Jennifer Parsons, female    DOB: 27-Jun-1976, 47 y.o.   MRN: [id number]  Chief Complaint  Patient presents with   Gynecologic Exam   Pt presents to the office today for CPE with pap. Pt reports she has not had a menstrual cycle in two years, however, the last two months she has had heavy bleeding for 7 days.  Gynecologic Exam The patient's primary symptoms include vaginal bleeding. The patient's pertinent negatives include no genital itching or vaginal discharge. This is a chronic problem. The current episode started more than 1 year ago. The problem occurs intermittently.  Nicotine Dependence Presents for follow-up visit. Her urge triggers include company of smokers. The symptoms have been stable. She smokes < 1/2 a pack of cigarettes per day.      Review of Systems  Genitourinary:  Negative for vaginal discharge.  All other systems reviewed and are negative.      Family History  Problem Relation Age of Onset   Uterine cancer Mother    COPD Father    Hypertension Sister    Breast cancer Maternal Aunt    Breast cancer Paternal Aunt    Ovarian cancer Paternal Aunt    Heart disease Maternal Grandmother    Stomach cancer Maternal Grandfather    Heart disease Maternal Grandfather    Diabetes Paternal Grandmother    Esophageal cancer Paternal Grandfather    Breast cancer Cousin    Hypertension Brother    Social History   Socioeconomic History   Marital status: Married    Spouse name: Not on file   Number of children: 1   Years of education: Not on file   Highest education level: Not on file  Occupational History   Occupation: Research scientist (life sciences): [employer]  Tobacco Use   Smoking status: Every Day    Current packs/day: 0.25    Types: Cigarettes   Smokeless tobacco: Never  Vaping Use   Vaping status: Never Used  Substance and Sexual Activity   Alcohol use: Yes    Comment: Occ.   Drug use: No   Sexual activity:  Yes  Other Topics Concern   Not on file  Social History Narrative   Not on file   Social Determinants of Health   Financial Resource Strain: Not on file  Food Insecurity: Not on file  Transportation Needs: Not on file  Physical Activity: Not on file  Stress: Not on file  Social Connections: Not on file    Objective:   Physical Exam Vitals reviewed.  Constitutional:      General: She is not in acute distress.    Appearance: She is well-developed.  HENT:     Head: Normocephalic and atraumatic.     Right Ear: Tympanic membrane normal.     Left Ear: Tympanic membrane normal.  Eyes:     Pupils: Pupils are equal, round, and reactive to light.  Neck:     Thyroid: No thyromegaly.  Cardiovascular:     Rate and Rhythm: Normal rate and regular rhythm.     Heart sounds: Normal heart sounds. No murmur heard. Pulmonary:     Effort: Pulmonary effort is normal. No respiratory distress.     Breath sounds: Normal breath sounds. No wheezing.  Abdominal:     General: Bowel sounds are normal. There is no distension.     Palpations: Abdomen is soft.     Tenderness: There is no abdominal  tenderness.  Musculoskeletal:        General: No tenderness. Normal range of motion.     Cervical back: Normal range of motion and neck supple.  Skin:    General: Skin is warm and dry.  Neurological:     Mental Status: She is alert and oriented to person, place, and time.     Cranial Nerves: No cranial nerve deficit.     Deep Tendon Reflexes: Reflexes are normal and symmetric.  Psychiatric:        Behavior: Behavior normal.        Thought Content: Thought content normal.        Judgment: Judgment normal.       BP (!) 145/98   Pulse 89   Temp 97.6 F (36.4 C) (Temporal)   Ht 5\' 3"  (1.6 m)   Wt 162 lb 6.4 oz (73.7 kg)   SpO2 99%   BMI 28.77 kg/m      Assessment & Plan:  Jennifer Parsons comes in today with chief complaint of Gynecologic Exam   Diagnosis and orders addressed:  1. Annual  physical exam - Cytology - PAP([place]) - WET PREP FOR TRICH, YEAST, CLUE  2. Gynecologic exam normal - US Pelvic Complete With Transvaginal; Future - Cytology - PAP([place]) - WET PREP FOR TRICH, YEAST, CLUE  3. Current smoker  4. Overweight (BMI 25.0-29.9)  5. Post-menopausal bleeding Pap pending Transvaginal pending  - US Pelvic Complete With Transvaginal; Future   Labs pending Health Maintenance reviewed Diet and exercise encouraged  Follow up plan: 1 year    Jannifer Rodney, FNP

## 2023-02-23 ENCOUNTER — Ambulatory Visit (HOSPITAL_COMMUNITY)
Admission: RE | Admit: 2023-02-23 | Discharge: 2023-02-23 | Disposition: A | Discharge status: Home / Self Care | Payer: Managed Care, Other (non HMO) | Source: Ambulatory Visit | Attending: Family | Admitting: Family

## 2023-02-23 DIAGNOSIS — N950 Postmenopausal bleeding: Secondary | ICD-10-CM | POA: Diagnosis present

## 2023-02-23 DIAGNOSIS — Z01419 Encounter for gynecological examination (general) (routine) without abnormal findings: Secondary | ICD-10-CM | POA: Insufficient documentation

## 2023-02-26 ENCOUNTER — Other Ambulatory Visit: Payer: Self-pay | Admitting: Family

## 2023-02-26 DIAGNOSIS — N950 Postmenopausal bleeding: Secondary | ICD-10-CM

## 2023-03-28 ENCOUNTER — Ambulatory Visit: Payer: Managed Care, Other (non HMO) | Admitting: Adult Health

## 2023-03-28 ENCOUNTER — Encounter: Payer: Self-pay | Admitting: Adult Health

## 2023-03-28 VITALS — BP 138/92 | HR 83 | Ht 63.0 in | Wt 158.5 lb

## 2023-03-28 DIAGNOSIS — N950 Postmenopausal bleeding: Secondary | ICD-10-CM

## 2023-03-28 DIAGNOSIS — R9389 Abnormal findings on diagnostic imaging of other specified body structures: Secondary | ICD-10-CM | POA: Diagnosis not present

## 2023-03-28 NOTE — Progress Notes (Signed)
Subjective:     Patient ID: Jennifer Parsons, female   DOB: 1976-06-07, 47 y.o.   MRN: [id number]  HPI [name] is a 47 year old white female, married, PM referred for PMB and thickened endometrium 8 mm on [place] 02/23/23. She had not had a period in 2.5 years and has had bleeding last 3 months, was heavy with cramps at times. She has labs with Tresa Res NP and HE4 was 80.9, CEA 6.8 PTH 69.  She says her mom had uterine cancer.  She works for [employer].   Last pap was 02/15/23 NILM at PCP  PCP is C Hawks.   Review of Systems +PMB +thickened endometrium    Reviewed past medical,surgical, social and family history. Reviewed medications and allergies.  Objective:   Physical Exam BP (!) 138/92 (BP Location: Left Arm, Patient Position: Sitting, Cuff Size: Normal)   Pulse 83   Ht 5\' 3"  (1.6 m)   Wt 158 lb 8 oz (71.9 kg)   LMP 02/08/2018   BMI 28.08 kg/m     Skin warm and dry.. Lungs: clear to ausculation bilaterally. Cardiovascular: regular rate and rhythm.  AA is 4 Fall risk is low    03/28/2023    3:16 PM 02/15/2023   10:34 AM 02/20/2020    9:11 AM  Depression screen PHQ 2/9  Decreased Interest 0 0 0  Down, Depressed, Hopeless 0 0 0  PHQ - 2 Score 0 0 0  Altered sleeping 0 0   Tired, decreased energy 0 0   Change in appetite 0 0   Feeling bad or failure about yourself  0 0   Trouble concentrating 3 0   Moving slowly or fidgety/restless 0 0   Suicidal thoughts 0 0   PHQ-9 Score 3 0   Difficult doing work/chores  Not difficult at all        03/28/2023    3:16 PM 02/15/2023   10:34 AM  GAD 7 : Generalized Anxiety Score  Nervous, Anxious, on Edge 0 0  Control/stop worrying 0 0  Worry too much - different things 0 0  Trouble relaxing 0 0  Restless 0 0  Easily annoyed or irritable 0 0  Afraid - awful might happen 0 0  Total GAD 7 Score 0 0  Anxiety Difficulty  Not difficult at all      Upstream - 03/28/23 1532       Pregnancy Intention Screening   Does the patient want to  become pregnant in the next year? No    Does the patient's partner want to become pregnant in the next year? No    Would the patient like to discuss contraceptive options today? No      Contraception Wrap Up   Current Method Female Sterilization    End Method Female Sterilization    Contraception Counseling Provided No             Assessment:     1. PMB (postmenopausal bleeding) No period in over 2 1/2 years and has had bleeding at times for last 3 months   2. Thickened endometrium Endometrium was 8 mm on [place] 02/23/23    Discussed need for endometrial biopsy to rule out endometrial cancer   Plan:     Return 04/03/23 at 2:10 pm for endometrial biopsy with Dr Despina Hidden

## 2023-04-03 ENCOUNTER — Encounter: Payer: Self-pay | Admitting: Obstetrics & Gynecology

## 2023-04-03 ENCOUNTER — Ambulatory Visit (INDEPENDENT_AMBULATORY_CARE_PROVIDER_SITE_OTHER): Payer: Managed Care, Other (non HMO) | Admitting: Obstetrics & Gynecology

## 2023-04-03 ENCOUNTER — Other Ambulatory Visit (HOSPITAL_COMMUNITY)
Admission: RE | Admit: 2023-04-03 | Discharge: 2023-04-03 | Disposition: A | Discharge status: Home / Self Care | Payer: Managed Care, Other (non HMO) | Source: Ambulatory Visit | Attending: Obstetrics & Gynecology | Admitting: Obstetrics & Gynecology

## 2023-04-03 VITALS — BP 130/89 | HR 81 | Ht 63.0 in | Wt 158.0 lb

## 2023-04-03 DIAGNOSIS — N950 Postmenopausal bleeding: Secondary | ICD-10-CM

## 2023-04-03 DIAGNOSIS — R9389 Abnormal findings on diagnostic imaging of other specified body structures: Secondary | ICD-10-CM | POA: Diagnosis present

## 2023-04-03 NOTE — Progress Notes (Signed)
Endometrial Biopsy Procedure Note  Pre-operative Diagnosis: Post menopausal bleeding with thickened endometrium 8mm on sonogram  Post-operative Diagnosis: same  Indications: postmenopausal bleeding  Procedure Details   Urine pregnancy test was not done.  The risks (including infection, bleeding, pain, and uterine perforation) and benefits of the procedure were explained to the patient and Written informed consent was obtained.  Antibiotic prophylaxis against endocarditis was not indicated.   The patient was placed in the dorsal lithotomy position.  Bimanual exam showed the uterus to be in the neutral position.  A Graves' speculum inserted in the vagina, and the cervix prepped with povidone iodine.  Endocervical curettage with a Kevorkian curette was not performed.   A sharp tenaculum was applied to the anterior lip of the cervix for stabilization.  A sterile uterine sound was used to sound the uterus to a depth of 6.5 cm.  A Pipelle endometrial aspirator was used to sample the endometrium.  Sample was sent for pathologic examination.  Condition: Stable  Complications: None  Plan:  The patient was advised to call for any fever or for prolonged or severe pain or bleeding. She was advised to use OTC analgesics as needed for mild to moderate pain. She was advised to avoid vaginal intercourse for 48 hours or until the bleeding has completely stopped.  Attending Physician Documentation: I was present for or performed the following: endometrial biopsy

## 2023-04-05 LAB — SURGICAL PATHOLOGY

## 2023-04-10 ENCOUNTER — Encounter: Payer: Self-pay | Admitting: Obstetrics & Gynecology

## 2023-04-10 ENCOUNTER — Telehealth (INDEPENDENT_AMBULATORY_CARE_PROVIDER_SITE_OTHER): Payer: Managed Care, Other (non HMO) | Admitting: Obstetrics & Gynecology

## 2023-04-10 DIAGNOSIS — N950 Postmenopausal bleeding: Secondary | ICD-10-CM

## 2023-04-10 DIAGNOSIS — R9389 Abnormal findings on diagnostic imaging of other specified body structures: Secondary | ICD-10-CM

## 2023-04-10 MED ORDER — MEDROXYPROGESTERONE ACETATE 10 MG PO TABS: 10.0000 mg | ORAL_TABLET | Freq: Every day | ORAL | 11 refills | Status: AC

## 2023-04-10 NOTE — Progress Notes (Signed)
MyChart video visit Patient is at work I am in my office Total time: 10 minutes  Follow up appointment for results: EMBx  PMB + ES 8 mm-->benign biopsy  Recommend cyclical porvera to assess endometrial response 10 mg x 10 days and see what her bleeding does   Last menstrual period 02/08/2018.    MEDS ordered this encounter: Meds ordered this encounter  Medications   medroxyPROGESTERone (PROVERA) 10 MG tablet    Sig: Take 1 tablet (10 mg total) by mouth daily.    Dispense:  10 tablet    Refill:  11    Orders for this encounter: No orders of the defined types were placed in this encounter.   Impression + Management Plan   ICD-10-CM   1. Thickened endometrium  R93.89     2. PMB (postmenopausal bleeding)  N95.0       Follow Up: Return if symptoms worsen or fail to improve.     All questions were answered.  Past Medical History:  Diagnosis Date   ADHD    Fibrocystic breast    GERD (gastroesophageal reflux disease)    Osteopenia     Past Surgical History:  Procedure Laterality Date   CATARACT EXTRACTION     ESOPHAGEAL DILATION     skin cancer removed  07/2022   face   TUBAL LIGATION     WISDOM TOOTH EXTRACTION      OB History     Gravida  2   Para  1   Term  1   Preterm      AB  1   Living  1      SAB  1   IAB      Ectopic      Multiple      Live Births  1           Allergies  Allergen Reactions   Bee Venom Anaphylaxis   Alpha-Gal Other (See Comments)    Rash, hands swell, diarrhea   Nutritional Supplements     Calcium gives her hives    Social History   Socioeconomic History   Marital status: Married    Spouse name: Not on file   Number of children: 1   Years of education: Not on file   Highest education level: Not on file  Occupational History   Occupation: Research scientist (life sciences): [employer]  Tobacco Use   Smoking status: Every Day    Current packs/day: 0.25    Types: Cigarettes    Smokeless tobacco: Never  Vaping Use   Vaping status: Never Used  Substance and Sexual Activity   Alcohol use: Yes    Comment: Occ.   Drug use: No   Sexual activity: Yes    Birth control/protection: Surgical    Comment: tubal  Other Topics Concern   Not on file  Social History Narrative   Not on file   Social Determinants of Health   Financial Resource Strain: Low Risk  (03/28/2023)   Overall Financial Resource Strain (CARDIA)    Difficulty of Paying Living Expenses: Not hard at all  Food Insecurity: No Food Insecurity (03/28/2023)   Hunger Vital Sign    Worried About Running Out of Food in the Last Year: Never true    Ran Out of Food in the Last Year: Never true  Transportation Needs: No Transportation Needs (03/28/2023)   PRAPARE - Administrator, Civil Service (Medical): No  Lack of Transportation (Non-Medical): No  Physical Activity: Sufficiently Active (03/28/2023)   Exercise Vital Sign    Days of Exercise per Week: 7 days    Minutes of Exercise per Session: 60 min  Stress: No Stress Concern Present (03/28/2023)   [employer] of Occupational Health - Occupational Stress Questionnaire    Feeling of Stress : Not at all  Social Connections: Socially Integrated (03/28/2023)   Social Connection and Isolation Panel [NHANES]    Frequency of Communication with Friends and Family: More than three times a week    Frequency of Social Gatherings with Friends and Family: More than three times a week    Attends Religious Services: More than 4 times per year    Active Member of [employer] or Organizations: No    Attends Engineer, structural: 1 to 4 times per year    Marital Status: Married    Family History  Problem Relation Age of Onset   Esophageal cancer Paternal Grandfather    Diabetes Paternal Grandmother    Heart disease Maternal Grandmother    Stomach cancer Maternal Grandfather    Heart disease Maternal Grandfather    COPD Father    Uterine cancer  Mother    Hypertension Brother    Hypertension Sister    Breast cancer Maternal Aunt    Breast cancer Paternal Aunt    Ovarian cancer Paternal Aunt    Breast cancer Cousin

## 2023-05-14 IMAGING — MG MM DIGITAL SCREENING BILAT W/ TOMO AND CAD
8 series · 8 of 24 positions shown · non-contrast
Comparison: None.

CLINICAL DATA: Screening.

EXAM:
DIGITAL SCREENING BILATERAL MAMMOGRAM WITH TOMOSYNTHESIS AND CAD
TECHNIQUE: Bilateral screening digital craniocaudal and mediolateral oblique
mammograms were obtained. Bilateral screening digital breast
tomosynthesis was performed. The images were evaluated with
computer-aided detection.

[R MLO synth-2D]
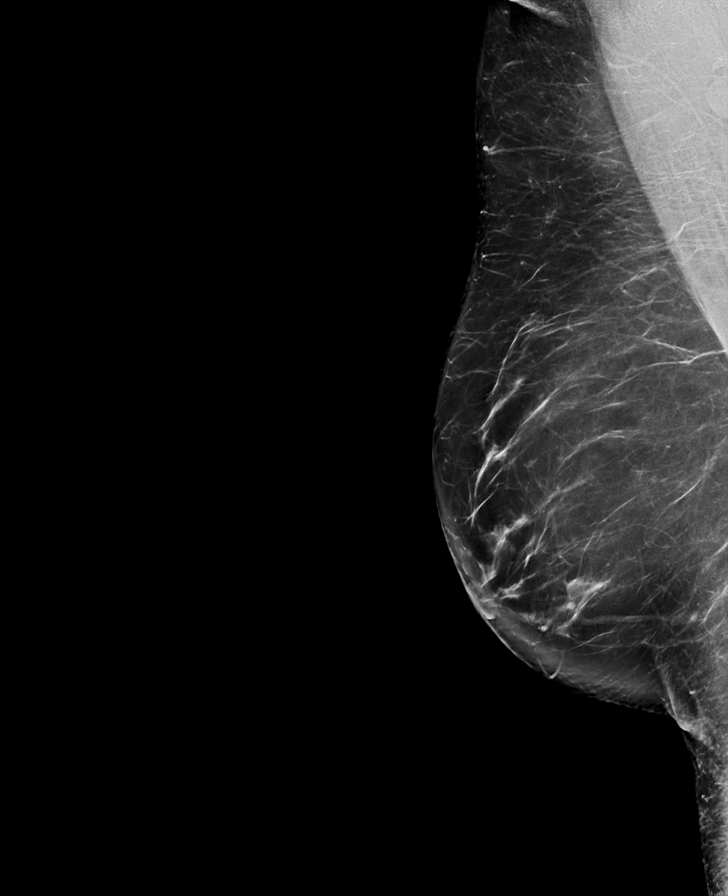

[R CC synth-2D]
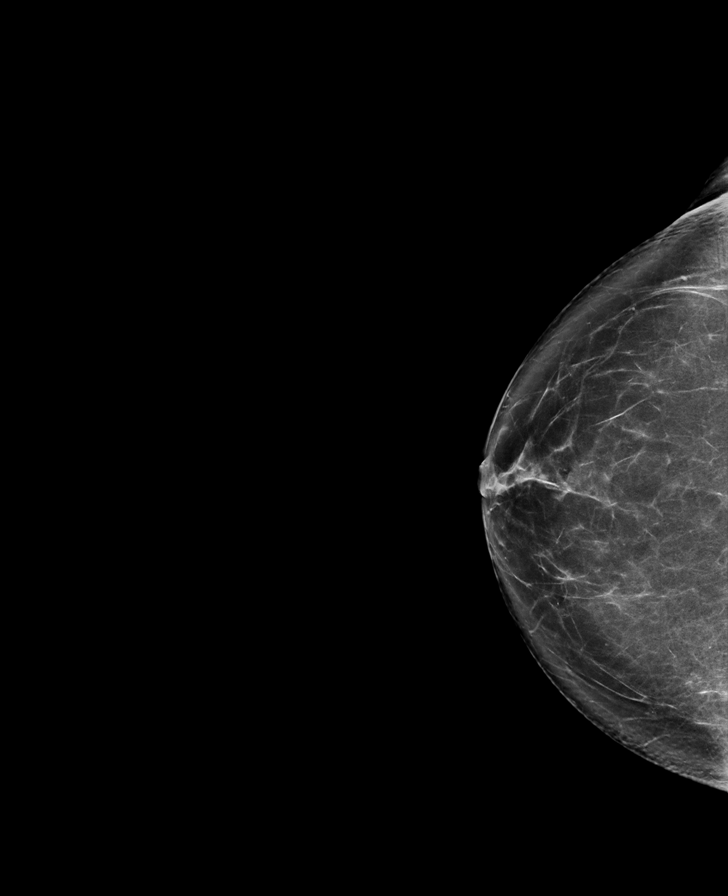

[L CC synth-2D]
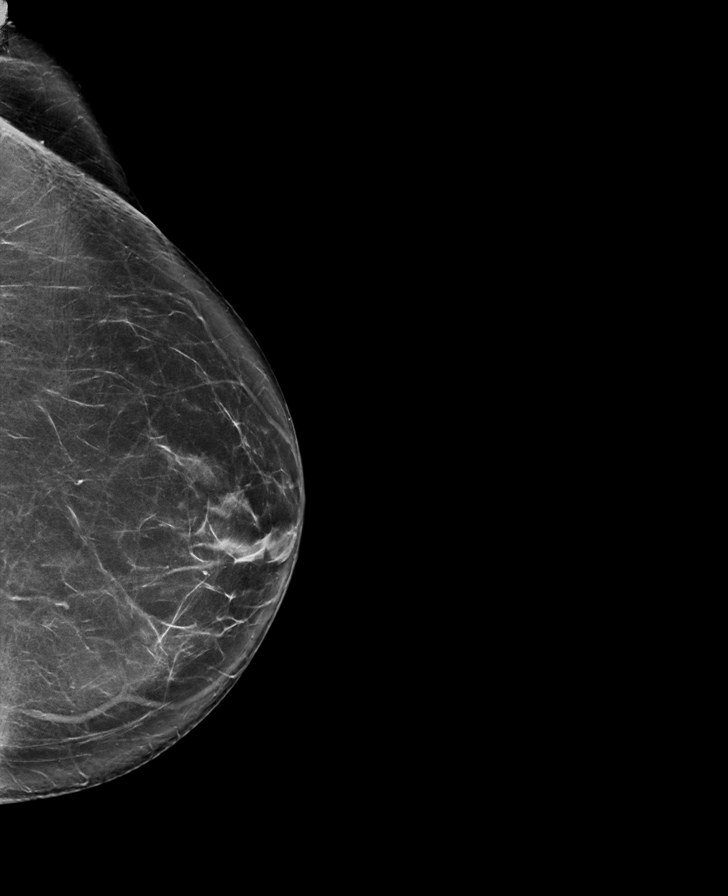

[L MLO synth-2D]
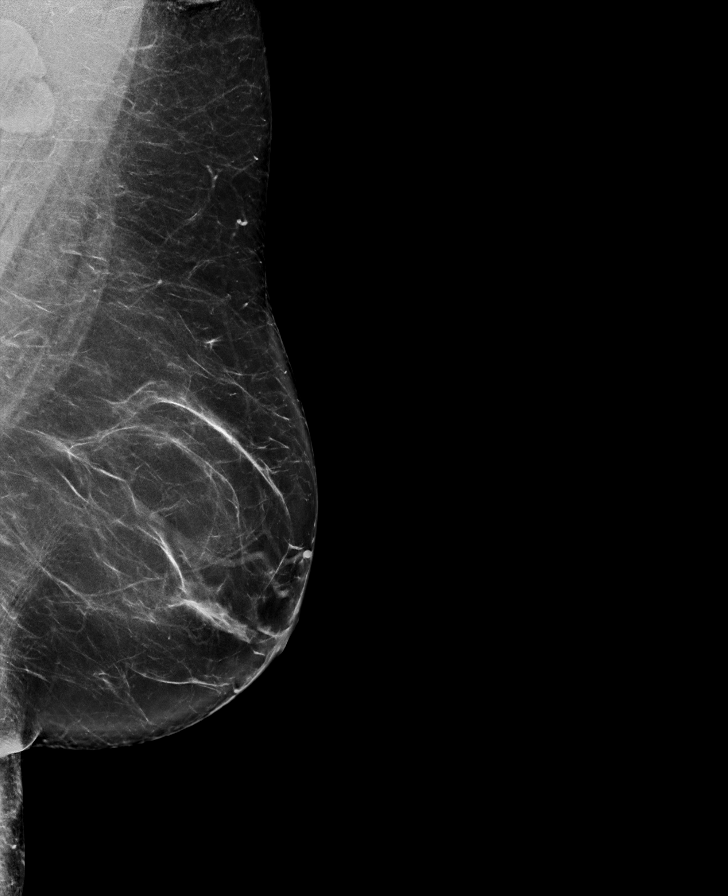

[R CC tomo · tomo slice 43/84.0]
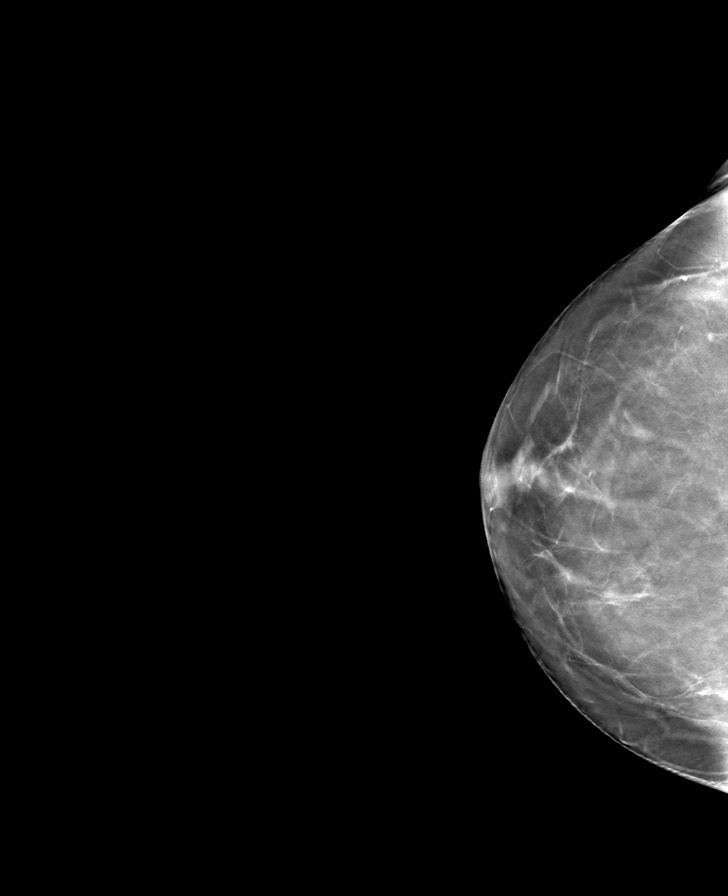

[L CC tomo · tomo slice 43/84.0]
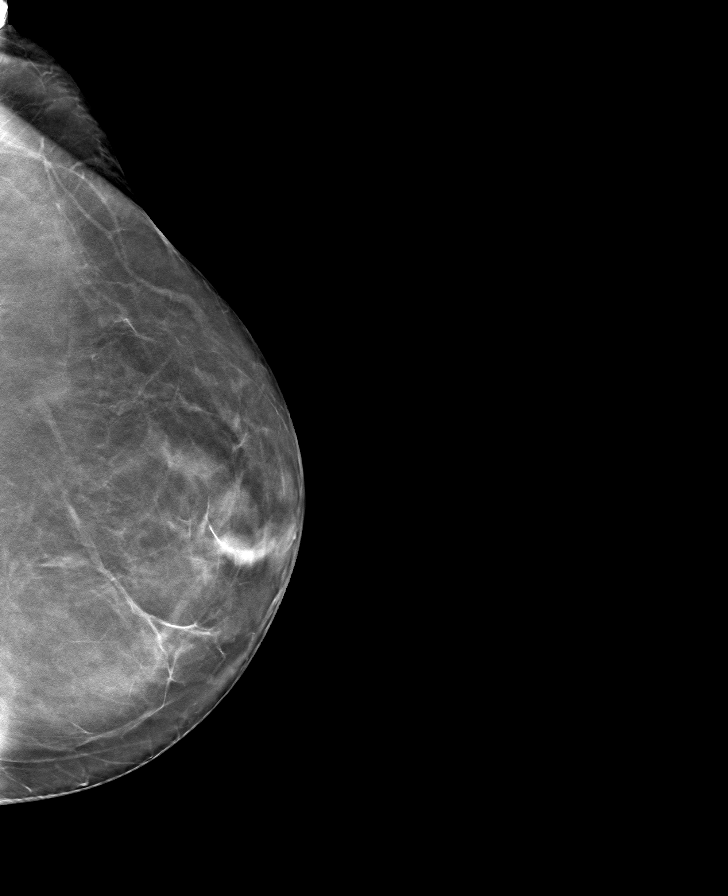

[R MLO tomo · tomo slice 45/90.0]
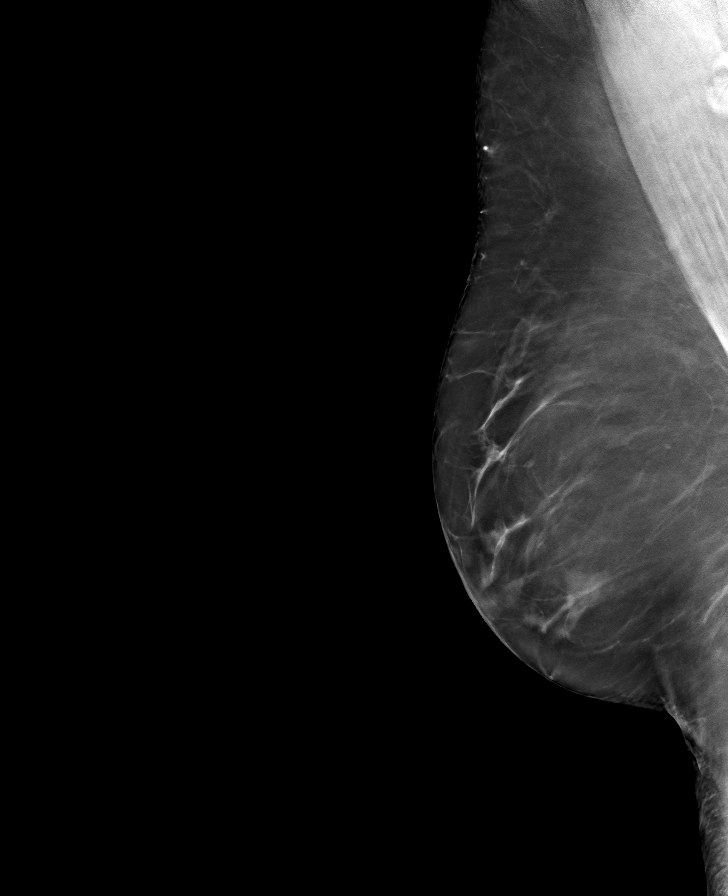

[L MLO tomo · tomo slice 45/89.0]
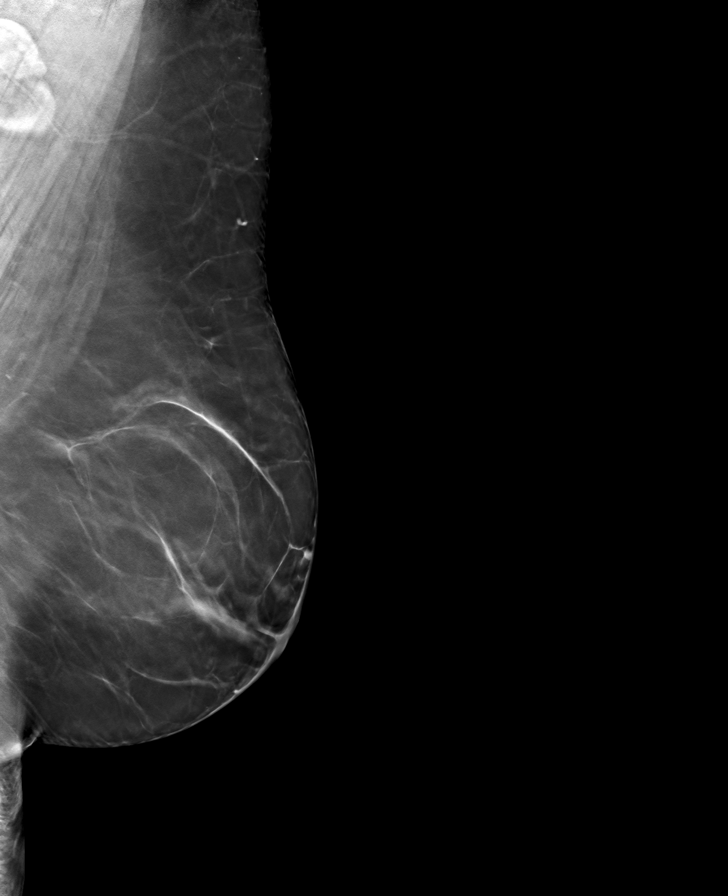

[8 of 24 positions shown; findings below may reference images not displayed]

ACR Breast Density Category b: There are scattered areas of
fibroglandular density.
FINDINGS: There are no findings suspicious for malignancy.
IMPRESSION: No mammographic evidence of malignancy. A result letter of this
screening mammogram will be mailed directly to the patient.

RECOMMENDATION:
Screening mammogram in one year. (Code:XG-X-X7B)

BI-RADS CATEGORY  1: Negative.
